# Patient Record
Sex: Female | Born: 1970 | Race: White | Hispanic: No | Marital: Married | Smoking: Former smoker
Health system: Southern US, Community
[De-identification: ages and names within clinical notes are randomized; demographics above are authoritative.]

## PROBLEM LIST (undated history)

## (undated) ENCOUNTER — Emergency Department (HOSPITAL_COMMUNITY): Admission: EM | Payer: Medicaid Other | Source: Home / Self Care

## (undated) DIAGNOSIS — I10 Essential (primary) hypertension: Secondary | ICD-10-CM

## (undated) DIAGNOSIS — E119 Type 2 diabetes mellitus without complications: Secondary | ICD-10-CM

## (undated) DIAGNOSIS — F32A Depression, unspecified: Secondary | ICD-10-CM

## (undated) DIAGNOSIS — G43909 Migraine, unspecified, not intractable, without status migrainosus: Secondary | ICD-10-CM

## (undated) DIAGNOSIS — N979 Female infertility, unspecified: Secondary | ICD-10-CM

## (undated) HISTORY — DX: Depression, unspecified: F32.A

## (undated) HISTORY — DX: Essential (primary) hypertension: I10

## (undated) HISTORY — DX: Migraine, unspecified, not intractable, without status migrainosus: G43.909

## (undated) HISTORY — PX: WRIST SURGERY: SHX841

## (undated) HISTORY — DX: Female infertility, unspecified: N97.9

## (undated) HISTORY — DX: Type 2 diabetes mellitus without complications: E11.9

---

## 2001-02-10 ENCOUNTER — Other Ambulatory Visit: Admission: RE | Admit: 2001-02-10 | Discharge: 2001-02-10 | Payer: Self-pay | Admitting: Obstetrics and Gynecology

## 2001-04-14 ENCOUNTER — Ambulatory Visit (HOSPITAL_COMMUNITY): Admission: RE | Admit: 2001-04-14 | Discharge: 2001-04-14 | Payer: Self-pay | Admitting: Obstetrics and Gynecology

## 2001-04-14 ENCOUNTER — Encounter: Payer: Self-pay | Admitting: Obstetrics and Gynecology

## 2001-06-13 ENCOUNTER — Encounter: Payer: Self-pay | Admitting: Obstetrics and Gynecology

## 2001-06-13 ENCOUNTER — Ambulatory Visit (HOSPITAL_COMMUNITY): Admission: RE | Admit: 2001-06-13 | Discharge: 2001-06-13 | Payer: Self-pay | Admitting: Obstetrics and Gynecology

## 2001-06-19 ENCOUNTER — Encounter (INDEPENDENT_AMBULATORY_CARE_PROVIDER_SITE_OTHER): Payer: Self-pay | Admitting: *Deleted

## 2001-06-20 ENCOUNTER — Inpatient Hospital Stay (HOSPITAL_COMMUNITY): Admission: AD | Admit: 2001-06-20 | Discharge: 2001-06-23 | Payer: Self-pay | Admitting: Obstetrics and Gynecology

## 2001-06-26 ENCOUNTER — Inpatient Hospital Stay (HOSPITAL_COMMUNITY): Admission: AD | Admit: 2001-06-26 | Discharge: 2001-06-26 | Payer: Self-pay | Admitting: Obstetrics and Gynecology

## 2001-07-03 ENCOUNTER — Encounter: Admission: RE | Admit: 2001-07-03 | Discharge: 2001-08-02 | Payer: Self-pay | Admitting: Obstetrics and Gynecology

## 2002-04-13 ENCOUNTER — Other Ambulatory Visit: Admission: RE | Admit: 2002-04-13 | Discharge: 2002-04-13 | Payer: Self-pay | Admitting: Obstetrics and Gynecology

## 2003-01-02 ENCOUNTER — Encounter: Payer: Self-pay | Admitting: Family Medicine

## 2003-01-02 ENCOUNTER — Emergency Department (HOSPITAL_COMMUNITY): Admission: AD | Admit: 2003-01-02 | Discharge: 2003-01-02 | Payer: Self-pay | Admitting: Family Medicine

## 2007-04-08 ENCOUNTER — Emergency Department (HOSPITAL_COMMUNITY): Admission: EM | Admit: 2007-04-08 | Discharge: 2007-04-08 | Payer: Self-pay | Admitting: Family Medicine

## 2007-05-08 ENCOUNTER — Emergency Department (HOSPITAL_COMMUNITY): Admission: EM | Admit: 2007-05-08 | Discharge: 2007-05-08 | Payer: Self-pay | Admitting: Family Medicine

## 2008-06-18 ENCOUNTER — Encounter: Admission: RE | Admit: 2008-06-18 | Discharge: 2008-06-18 | Payer: Self-pay | Admitting: Family Medicine

## 2008-06-28 ENCOUNTER — Encounter: Admission: RE | Admit: 2008-06-28 | Discharge: 2008-06-28 | Payer: Self-pay | Admitting: Family Medicine

## 2009-01-05 ENCOUNTER — Emergency Department (HOSPITAL_COMMUNITY): Admission: EM | Admit: 2009-01-05 | Discharge: 2009-01-05 | Payer: Self-pay | Admitting: Emergency Medicine

## 2010-07-28 NOTE — H&P (Signed)
[facility]  Patient:    Tina Kerr, Tina Kerr Visit Number: [id number] MRN: [id number]          Service Type: OBS Location: [facility] Attending Physician:  Leonard Schwartz Dictated by:   Saverio Danker, C.N.M. Admit Date:  06/19/2001                           History and Physical  BRIEF HISTORY:   The patient is a 40 year old married white female, gravida 5, para 1, 0-3-2, at [redacted] weeks gestation by LMP and ultrasound who presents for evaluation secondary to some blood pressure elevations at the office and 1+ protein on a catheterized specimen.  He denies headaches, nausea, vomiting or visual disturbances.  Her pregnancy has been followed at [facility] by the Certified Nurse Midwife Service to date and is at risk secondary to: 1. A history of previous twin gestation 2. Previous preeclampsia with that gestation. 3. Previous C-section secondary to failure to dilate and desires trial of    labor. 4. History of hypothyroidism diagnosed 7 months post partum but has had no    medications with this pregnancy.  OB GYN HISTORY:  She is a gravida 5, para 1-0-3-1 who had selective ABs in 1990, 1991, and 1992 all without complications.  In May of 2001, she delivered a viable set of twins, the female infant was 6 pounds 5 ounces at [redacted] weeks gestation.  The female was 4 pounds 11 ounces, also of course at [redacted] weeks gestation.  She was induced for approximately 24 hours with Pitocin but did not dilate beyond the fingertip and was then recommended to proceed with a C-section.  She reports that she had "mild" preeclampsia with that pregnancy with elevated liver enzymes and proteinuria.  She reports that she never had elevated blood pressures and was never put on magnesium sulfate.  That pregnancy was conceived by in vitro fertilization.  GENERAL MEDICAL HISTORY:  She has no known drug allergies.  She reports having had the usual childhood diseases.   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  She reports occasional urinary tract infections, and a history of depression following delivery, a history of smoking for 9 years but she discontinued that in 2000 and her only surgeries were a C-section in 2001, and wisdom teeth removed in 1998.  FAMILY HISTORY:  Significant for paternal grandfather and maternal grandfather with heart disease and myocardial infarction.  Maternal grandmother with emphysema, maternal grandmother with hyperthyroidism.  Maternal grandmother with rheumatoid arthritis.  Paternal grandfather with CVA.  GENETIC HISTORY:  Significant only for a niece with some kind of congenital heart disease that required surgery and the father of the infants niece with some kind of blood clotting disorder.  SOCIAL HISTORY:  She is married to Derry Skill who is involved and supportive.  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 They are of the [employer].  They deny any illicit drug use, alcohol or smoking with this pregnancy.  PRENATAL LABORATORY DATA:  Her blood type is A+, antibody screen is negative, toxoplasmosis screen was negative, syphilis is nonreactive.  Rubella is immune.  HBsAg is negative.  TSH in February 2003, was 0.877.  Her GC and chlamydia are both  negative.  Pap smear is within normal limits.  Her 1 hour glucola was 94, and her beta strep culture at 36 weeks is negative.  Group B streptococcus is negative.  PHYSICAL EXAMINATION:  VITAL SIGNS:  Her blood pressures varied from 125 to 133 systolically over 85 to 90 diastolics.  Temperature:  She is afebrile.  HEENT:  Grossly within normal limits  HEART:  Regular rhythm and rate.  CHEST:  Clear.  BREASTS:  Soft and nontender.  ABDOMEN:  Gravid with  irregular mild uterine contractions.  FETAL HEART RATE:  Reactive and reassuring.  PELVIC:  Closed, thick, posterior and vertex per Mack Guise, C.N.M., at the office.  EXTREMITIES:  Within normal limits.  Her catheterized urinalysis protein was 1+, 250 of blood, otherwise negative. Her platelets were 234.  SGOT was 14, SGPT 12, uric acid was 5.7, LDH was 127.   ASSESSMENT: 1. Intrauterine pregnancy at [redacted] weeks gestation. 2. Previous C-section. 3. Desirous of trial of labor and proteinuria on a catheterized specimen and    increasing blood pressures with a history of preeclampsia with her last    pregnancy.  PLAN:  Per consult with Dr. Dierdre Forth is to discuss the options of induction of labor and its risks and benefits of a repeat C-section or the possibility of continued observation and further evaluation.  These were reviewed at length and the patient has elected to proceed with further evaluation including a 24-hour urine, to verify the diagnosis of preeclampsia prior to committing to a more invasive plan of action. Dictated by:   Vance Gather Duplantis, C.N.M. Attending Physician:  Leonard Schwartz DD:  06/19/01 TD:  06/19/01 Job: [id number]

## 2010-07-28 NOTE — Op Note (Signed)
[facility]  Patient:    Tina Kerr, Tina Kerr Visit Number: [id number] MRN: [id number]          Service Type: OBS Location: 910A 9139 01 Attending Physician:  Leonard Schwartz Dictated by:   Pierre Bali Normand Sloop, M.D. Proc. Date: 06/20/01 Admit Date:  06/19/2001                             Operative Report  PREOPERATIVE DIAGNOSIS:       Pregnancy at term with pregnancy-induced                               hypertension.  POSTOPERATIVE DIAGNOSIS:      Pregnancy at term with pregnancy-induced                               hypertension.  OPERATION:                    Repeat cesarean section.  SURGEON:                      Naima A. Normand Sloop, M.D.  ASSISTANT:                    Nigel Bridgeman, C.N.M.  ANESTHESIA:                   Spinal.  ESTIMATED BLOOD LOSS:         1000 cc.  IV FLUIDS:                    1500 cc.  URINE OUTPUT:                 200 cc of clear urine.  FINDINGS:                     A female infant, vertex presentation with Apgars of 9 and 9.  Weight of 6 pounds and 2 ounces.  Normal uterus, tubes, and ovaries bilaterally.  COMPLICATIONS:                None.  The patient went to the recovery room in stable condition.  DESCRIPTION OF PROCEDURE:     The patient was taken to the operating room before the procedure was to take place.  The patient understood the risks are, but not limited to bleeding, infection, damage to internal organs such as bowel, bladder, and ureters, or major blood vessels.  The patient was taken to the operating room where she was given spinal anesthesia, placed in the dorsal supine position with a left lateral tilt, and prepped and draped in the normal sterile fashion.  A Pfannenstiel skin incision was then made 2 cm above the symphysis pubis with the knife and carried down to the fascia using Bovie cautery.  The fascia was then excised in the midline and extended bilaterally using the Bovie cautery.   Kochers x2 were placed on the superior aspect of the fascia which was dissected off the rectus muscle both bluntly and sharply using Mayo scissors.  The inferior aspect of the fascia was separated from the rectus muscle in a similar fashion.  The rectus muscle was then separated in the midline.  The peritoneum was identified, tinted up, and entered bluntly.  A  bladder blade was then inserted.  The vesicouterine peritoneum was identified, tinted up, and entered sharply with the Metzenbaum scissors, and extended bilaterally, and a bladder flap was created digitally.  The bladder blade was then reinserted.  A repeat lower transverse uterine incision was then made with the knife and extended bluntly.  The infant was delivered without difficulty.  There was no meconium and no nuchal cord.  The mouth and nares were suctioned with bulb suction and body delivered.  Cord clamped and cut.  The infant was handed off to the waiting pediatricians.  Cord blood was obtained and the placenta was manually extracted.  The uterus cleared of all clot and debris.  The uterine incision was repaired with 0 Vicryl in a running locked fashion.  There was two areas on the uterus which still had some bleeding which was made hemostatic using figure-of-eight reinforcement stitch.  The abdomen was irrigated with saline. The rectus muscle was approximated using a figure-of-eight 0 Vicryl stitch. The fascia was closed using 0 Vicryl in a running fashion.  The subcutaneous tissue was irrigated copiously with saline.  Any bleeding areas were made hemostatic using Bovie cautery.  The skin was closed with staples.  Sponge, lap, and needle counts were correct x2.  The patient went to the recovery room in stable condition. Dictated by:   Pierre Bali. Normand Sloop, M.D. Attending Physician:  Leonard Schwartz DD:  06/20/01 TD:  06/22/01 Job: [id number]

## 2010-07-28 NOTE — Discharge Summary (Signed)
[facility]  Patient:    Tina Kerr, Tina Kerr Visit Number: [id number] MRN: [id number]          Service Type: OBS Location: 910A 9139 01 Attending Physician:  Leonard Schwartz Dictated by:   Wynelle Bourgeois, CNM Admit Date:  06/19/2001 Discharge Date: 06/23/2001                             Discharge Summary  ADMISSION DIAGNOSES: 1. Intrauterine pregnancy at 37 weeks. 2. Previous cesarean section. 3. Planned trial of labor. 4. Proteinuria. 5. Hypertension.  DISCHARGE DIAGNOSES: 1. Intrauterine pregnancy at 37 weeks. 2. Previous cesarean section. 3. Planned trial of labor. 4. Proteinuria. 5. Hypertension. 6. Status post elective repeat cesarean section.  PROCEDURES: 1. Spinal anesthesia. 2. Repeat cesarean section.  HOSPITAL COURSE:  The patient was admitted for proteinuria on a catheter urine specimen and elevated blood pressures.  At that point, she had no headache or nausea or vomiting or visual disturbances.  Her pregnancy had been remarkable for a previous cesarean section secondary to failure to progress and a history of preeclampsia with her first pregnancy which was a twin gestation.  She was admitted for an elective trial of labor, and had an unfavorable cervix at the beginning of the induction.  Risks and benefits were discussed by Dr. Pennie Rushing, and she elected to proceed with a trial of labor.  As the day went on, her blood pressures increased to 181/100.  A 24 hour urine was in process, but had increasing blood pressures.  Decision was made by the parents to proceed with an elective repeat cesarean section which was performed on 06/20/01, for a viable female infant in a vertex presentation with Apgars of 9 and 9, weight 6 pounds 2 ounces with no complications.  The patient has recovered well, and has stabilized her blood pressures within normal limits to 120s to 140s/70s to 80s with no further development of proteinuria or  other signs of preeclampsia. She was not placed on magnesium sulfate postoperatively.  On postoperative day #3, her vital signs were stable, she was afebrile, lungs were clear, heart rate was regular rate and rhythm, there was no edema.  Deep tendon reflexes were 1+ to 2+ with no beats of clonus.  Abdomen was soft and appropriately tender.  Incision was clean, dry, and intact with staples.  Lochia was small. Extremities within normal limits.  She was deemed to have received the full benefit of her hospital stay and was discharged home on postoperative day #3.  DISCHARGE MEDICATIONS: 1. Motrin 600 mg p.o. q.6h. p.r.n. 2. Tylox one or two p.o. q.3-4h. p.r.n. 3. Prenatal vitamins.  DISCHARGE LABORATORY DATA:  White blood cell count 8.9, hemoglobin 10.8, hematocrit 32.3, platelets 225.  Chemistries within normal limits.  Uric acid 5.8 on June 20, 2001.  TSH was 1.244.  A 24 hour urine showed less than 6 mg/dL of protein.  RPR nonreactive.  DISCHARGE FOLLOWUP:  In six weeks at [facility], as well as Wednesday for staple removal.  DISCHARGE INSTRUCTIONS:  Per [facility] handout. Dictated by:   Wynelle Bourgeois, CNM Attending Physician:  Leonard Schwartz DD:  06/23/01 TD:  06/23/01 Job: [phone] [id number]

## 2011-05-08 ENCOUNTER — Ambulatory Visit (INDEPENDENT_AMBULATORY_CARE_PROVIDER_SITE_OTHER): Payer: Self-pay | Admitting: Family Medicine

## 2011-05-08 VITALS — BP 136/85 | HR 91 | Temp 98.1°F | Resp 16 | Ht 64.5 in | Wt 216.0 lb

## 2011-05-08 DIAGNOSIS — I10 Essential (primary) hypertension: Secondary | ICD-10-CM

## 2011-05-08 DIAGNOSIS — R002 Palpitations: Secondary | ICD-10-CM

## 2011-05-08 MED ORDER — LISINOPRIL 10 MG PO TABS: 10.0000 mg | ORAL_TABLET | Freq: Every day | ORAL | Status: DC

## 2011-05-08 NOTE — Progress Notes (Signed)
  Subjective:    Patient ID: Tina Kerr, female    DOB: December 01, 1970, 41 y.o.   MRN: [id number]  HPI 41 yo female with history of hypertension, off meds for 6 months, but has been following BP's at home. Did take lisinopril 10mg .  Has been feeling dizzy so took BP and was 140/100. No CP or SOB.  Did feel some palpitations at work today, brief.   Review of Systems Negative except as per HPI     Objective:   Physical Exam  Constitutional: She appears well-developed and well-nourished.  Cardiovascular: Normal rate, regular rhythm, normal heart sounds and intact distal pulses.   No murmur heard. Pulmonary/Chest: Effort normal and breath sounds normal.  Neurological: She is alert.  Skin: Skin is warm and dry.    EKG - NSR, normal ST -T, normal intervals.      Assessment & Plan:  HTN - start lisinopril 10.  Monitor at home.  Call if not getting to goal in a week. BMET pending.

## 2011-05-09 LAB — BASIC METABOLIC PANEL
BUN: 19 mg/dL (ref 6–23)
Calcium: 9.3 mg/dL (ref 8.4–10.5)
Creat: 0.74 mg/dL (ref 0.50–1.10)

## 2011-11-23 ENCOUNTER — Emergency Department (INDEPENDENT_AMBULATORY_CARE_PROVIDER_SITE_OTHER)
Admission: EM | Admit: 2011-11-23 | Discharge: 2011-11-23 | Disposition: A | Discharge status: Home / Self Care | Payer: BC Managed Care – PPO | Source: Home / Self Care

## 2011-11-23 ENCOUNTER — Encounter (HOSPITAL_COMMUNITY): Payer: Self-pay | Admitting: Emergency Medicine

## 2011-11-23 DIAGNOSIS — I10 Essential (primary) hypertension: Secondary | ICD-10-CM

## 2011-11-23 DIAGNOSIS — R609 Edema, unspecified: Secondary | ICD-10-CM

## 2011-11-23 DIAGNOSIS — R600 Localized edema: Secondary | ICD-10-CM

## 2011-11-23 MED ORDER — LISINOPRIL-HYDROCHLOROTHIAZIDE 20-12.5 MG PO TABS: 1.0000 | ORAL_TABLET | Freq: Every day | ORAL | Status: DC

## 2011-11-23 NOTE — ED Provider Notes (Signed)
Medical screening examination/treatment/procedure(s) were performed by non-physician practitioner and as supervising physician I was immediately available for consultation/collaboration.  Petr Bontempo, M.D.   Monroe Toure C Tyrin Herbers, MD 11/23/11 2318 

## 2011-11-23 NOTE — ED Provider Notes (Signed)
History     CSN: [id number]  Arrival date & time 11/23/11  1632   None     Chief Complaint  Patient presents with  . 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  Denies pain. No trauma, no prolonged standing or sitting. No hx of similar edema. No pain but some discomfort in the legs. No hx of venous insufficiency.    History reviewed. No pertinent past medical history.  Past Surgical History  Procedure Date  . Cesarean section     No family history on file.  History  Substance Use Topics  . Smoking status: Former Smoker    Quit date: 04/07/2011  . Smokeless tobacco: Not on file  . Alcohol Use: Not on file    OB History    Grav Para Term Preterm Abortions TAB SAB Ect Mult Living                  Review of Systems  Constitutional: Negative.  Negative for fever, chills, diaphoresis, activity change, appetite change and fatigue.  Respiratory: Negative.   Cardiovascular: Negative.   Gastrointestinal: Negative.   Genitourinary: Negative.   Musculoskeletal: Negative for back pain, joint swelling and gait problem.  Skin: Negative for color change, pallor, rash and wound.  Neurological: Negative.     Allergies  Review of patient's allergies indicates no known allergies.  Home Medications   Current Outpatient Rx  Name Route Sig Dispense Refill  . LEVONORGESTREL 20 MCG/24HR IU IUD Intrauterine 1 each by Intrauterine route once.    Marland Kitchen LISINOPRIL-HYDROCHLOROTHIAZIDE 20-12.5 MG PO TABS Oral Take 1 tablet by mouth daily. 30 tablet 1    BP 140/100  Pulse 90  Temp 99 F (37.2 C) (Oral)  Resp 18  SpO2 98%  Physical Exam  Constitutional: She is oriented to person, place, and time. She appears well-developed and well-nourished. No distress.  Eyes: EOM are normal. Pupils are equal, round, and reactive to light.    Neck: Normal range of motion. Neck supple.  Cardiovascular: Normal rate and normal heart sounds.   No murmur heard. Pulmonary/Chest: Effort normal and breath sounds normal. No respiratory distress. She has no wheezes. She has no rales.  Musculoskeletal: Normal range of motion. She exhibits edema. She exhibits no tenderness.       1+ pitting edema to shins, mild edema. No calf or other tenderness, no discoloration.   Neurological: She is alert and oriented to person, place, and time.  Skin: Skin is warm and dry. No rash noted. No erythema.    ED Course  Procedures (including critical care time)  Labs Reviewed - No data to display No results found.   1. Edema of both legs   2. Hypertension       MDM  Reassurance, Compression stockings of low pressure, elevate legs. Change to Lisinopril 20/12.5 q d and see your MD in 2 weeks for BP chk.  For pain, redness or increased edema Go to ED         Hayden Rasmussen, NP 11/23/11 2218

## 2011-11-23 NOTE — ED Notes (Signed)
C/o of bilateral leg swelling below knee since yesterday. Denies injury Pedal pluses present. States feels bloated. Ate chinese food yesterday

## 2018-05-28 ENCOUNTER — Telehealth: Payer: Self-pay | Admitting: Family

## 2018-05-28 DIAGNOSIS — R6889 Other general symptoms and signs: Secondary | ICD-10-CM

## 2018-05-28 NOTE — Progress Notes (Signed)
E-Visit for Corona Virus Screening Based on your current symptoms, it seems like that your symptoms could be related to the [place] virus. It is very important that you self isolate and avoid contact with anyone over the age of 29 years or older.   Rest, force fluids, tylenol or motrin as needed. If your symptoms worsen or do not improve you will need to be seen face to face.   Coronavirus disease 2019 (COVID-19) is a respiratory illness that can spread from person to person. The virus that causes COVID-19 is a new virus that was first identified in the country of [place] but is now found in multiple other countries and has spread to the [place].  Symptoms associated with the virus are mild to severe fever, cough, and shortness of breath. There is currently no vaccine to protect against COVID-19, and there is no specific antiviral treatment for the virus.  It is vitally important that if you feel that you have an infection such as this virus or any other virus that you stay home and away from places where you may spread it to others.  Currently, not all patients are being tested. If the symptoms are mild and there is not a known exposure, performing the test is not indicated.   Reduce your risk of any infection by using the same precautions used for avoiding the common cold or flu:  Marland Kitchen Wash your hands often with soap and warm water for at least 20 seconds.  If soap and water are not readily available, use an alcohol-based hand sanitizer with at least 60% alcohol.  . If coughing or sneezing, cover your mouth and nose by coughing or sneezing into the elbow areas of your shirt or coat, into a tissue or into your sleeve (not your hands). . Avoid shaking hands with others and consider head nods or verbal greetings only. . Avoid touching your eyes, nose, or mouth with unwashed hands.  . Avoid close contact with people who are sick. . Avoid places or events with large numbers of people in one location,  like concerts or sporting events. . Carefully consider travel plans you have or are making. . If you are planning any travel outside or inside the [place], visit the CDC's Travelers' Health webpage for the latest health notices. . If you have some symptoms but not all symptoms, continue to monitor at home and seek medical attention if your symptoms worsen. . If you are having a medical emergency, call 911.  HOME CARE . Only take medications as instructed by your medical team. . Drink plenty of fluids and get plenty of rest. . A steam or ultrasonic humidifier can help if you have congestion.   GET HELP RIGHT AWAY IF: . You develop worsening fever. . You become short of breath . You cough up blood. . Your symptoms become more severe MAKE SURE YOU   Understand these instructions.  Will watch your condition.  Will get help right away if you are not doing well or get worse.  Your e-visit answers were reviewed by a board certified advanced clinical practitioner to complete your personal care plan.  Depending on the condition, your plan could have included both over the counter or prescription medications.  If there is a problem please reply once you have received a response from your provider. Your safety is important to [place].  If you have drug allergies check your prescription carefully.    You can use MyChart to ask questions  about today's visit, request a non-urgent call back, or ask for a work or school excuse for 24 hours related to this e-Visit. If it has been greater than 24 hours you will need to follow up with your provider, or enter a new e-Visit to address those concerns. You will get an e-mail in the next two days asking about your experience.  I hope that your e-visit has been valuable and will speed your recovery. Thank you for using e-visits.

## 2019-05-09 ENCOUNTER — Ambulatory Visit: Payer: Self-pay | Attending: Internal Medicine

## 2019-05-09 DIAGNOSIS — Z23 Encounter for immunization: Secondary | ICD-10-CM | POA: Insufficient documentation

## 2019-05-09 NOTE — Progress Notes (Signed)
   Covid-19 Vaccination Clinic  Name:  Tina Kerr    MRN: [id number] DOB: [birth year]  05/09/2019  [name] was observed post Covid-19 immunization for 15 minutes without incidence. She was provided with Vaccine Information Sheet and instruction to access the V-Safe system.   [name] was instructed to call 911 with any severe reactions post vaccine: Marland Kitchen Difficulty breathing  . Swelling of your face and throat  . A fast heartbeat  . A bad rash all over your body  . Dizziness and weakness    Immunizations Administered    Name Date Dose VIS Date Route   Pfizer COVID-19 Vaccine 05/09/2019  2:30 PM 0.3 mL 02/20/2019 Intramuscular   Manufacturer: [employer], [employer]   Lot: [id number]   NDC: [address]-2

## 2019-05-30 ENCOUNTER — Ambulatory Visit: Payer: Medicaid Other | Attending: Internal Medicine

## 2019-05-30 DIAGNOSIS — Z23 Encounter for immunization: Secondary | ICD-10-CM

## 2019-05-30 NOTE — Progress Notes (Signed)
   Covid-19 Vaccination Clinic  Name:  Tina Kerr    MRN: [id number] DOB: 1971-02-06  05/30/2019  [name] was observed post Covid-19 immunization for 15 minutes without incident. She was provided with Vaccine Information Sheet and instruction to access the V-Safe system.   [name] was instructed to call 911 with any severe reactions post vaccine: Marland Kitchen Difficulty breathing  . Swelling of face and throat  . A fast heartbeat  . A bad rash all over body  . Dizziness and weakness   Immunizations Administered    Name Date Dose VIS Date Route   Pfizer COVID-19 Vaccine 05/30/2019 11:08 AM 0.3 mL 02/20/2019 Intramuscular   Manufacturer: [employer], [employer]   Lot: [id number]   NDC: [address]-9

## 2020-07-14 LAB — EXTERNAL GENERIC LAB PROCEDURE: COLOGUARD: NEGATIVE

## 2020-07-14 LAB — COLOGUARD: COLOGUARD: NEGATIVE

## 2020-12-20 ENCOUNTER — Emergency Department (HOSPITAL_COMMUNITY): Payer: 59

## 2020-12-20 ENCOUNTER — Other Ambulatory Visit: Payer: Self-pay

## 2020-12-20 ENCOUNTER — Inpatient Hospital Stay (HOSPITAL_COMMUNITY)
Admission: EM | Admit: 2020-12-20 | Discharge: 2020-12-21 | DRG: 311 | Disposition: A | Discharge status: Home / Self Care | Payer: 59 | Attending: Internal Medicine | Admitting: Internal Medicine

## 2020-12-20 DIAGNOSIS — Z20822 Contact with and (suspected) exposure to covid-19: Secondary | ICD-10-CM | POA: Diagnosis present

## 2020-12-20 DIAGNOSIS — E871 Hypo-osmolality and hyponatremia: Secondary | ICD-10-CM | POA: Diagnosis present

## 2020-12-20 DIAGNOSIS — R079 Chest pain, unspecified: Secondary | ICD-10-CM

## 2020-12-20 DIAGNOSIS — R6884 Jaw pain: Secondary | ICD-10-CM | POA: Diagnosis present

## 2020-12-20 DIAGNOSIS — Z8249 Family history of ischemic heart disease and other diseases of the circulatory system: Secondary | ICD-10-CM | POA: Diagnosis not present

## 2020-12-20 DIAGNOSIS — I200 Unstable angina: Principal | ICD-10-CM | POA: Diagnosis present

## 2020-12-20 DIAGNOSIS — R072 Precordial pain: Secondary | ICD-10-CM | POA: Diagnosis not present

## 2020-12-20 DIAGNOSIS — E876 Hypokalemia: Secondary | ICD-10-CM | POA: Diagnosis present

## 2020-12-20 DIAGNOSIS — I10 Essential (primary) hypertension: Secondary | ICD-10-CM | POA: Diagnosis present

## 2020-12-20 DIAGNOSIS — T502X5A Adverse effect of carbonic-anhydrase inhibitors, benzothiadiazides and other diuretics, initial encounter: Secondary | ICD-10-CM | POA: Diagnosis present

## 2020-12-20 DIAGNOSIS — Z87891 Personal history of nicotine dependence: Secondary | ICD-10-CM | POA: Diagnosis not present

## 2020-12-20 DIAGNOSIS — R7303 Prediabetes: Secondary | ICD-10-CM | POA: Diagnosis present

## 2020-12-20 DIAGNOSIS — K219 Gastro-esophageal reflux disease without esophagitis: Secondary | ICD-10-CM | POA: Diagnosis present

## 2020-12-20 DIAGNOSIS — E785 Hyperlipidemia, unspecified: Secondary | ICD-10-CM | POA: Diagnosis present

## 2020-12-20 DIAGNOSIS — K760 Fatty (change of) liver, not elsewhere classified: Secondary | ICD-10-CM | POA: Diagnosis present

## 2020-12-20 DIAGNOSIS — R109 Unspecified abdominal pain: Secondary | ICD-10-CM

## 2020-12-20 DIAGNOSIS — Z8619 Personal history of other infectious and parasitic diseases: Secondary | ICD-10-CM

## 2020-12-20 DIAGNOSIS — Z79899 Other long term (current) drug therapy: Secondary | ICD-10-CM

## 2020-12-20 LAB — CBC
HCT: 44.9 % (ref 36.0–46.0)
Hemoglobin: 14.1 g/dL (ref 12.0–15.0)
MCH: 28.1 pg (ref 26.0–34.0)
MCHC: 31.4 g/dL (ref 30.0–36.0)
MCV: 89.4 fL (ref 80.0–100.0)
Platelets: 369 10*3/uL (ref 150–400)
RBC: 5.02 MIL/uL (ref 3.87–5.11)
RDW: 12.9 % (ref 11.5–15.5)
WBC: 12.4 10*3/uL — ABNORMAL HIGH (ref 4.0–10.5)
nRBC: 0 % (ref 0.0–0.2)

## 2020-12-20 LAB — TROPONIN I (HIGH SENSITIVITY)
Troponin I (High Sensitivity): 3 ng/L (ref <18)
Troponin I (High Sensitivity): 2 ng/L (ref <18)

## 2020-12-20 LAB — BASIC METABOLIC PANEL
Anion gap: 12 (ref 5–15)
BUN: 23 mg/dL — ABNORMAL HIGH (ref 6–20)
CO2: 28 mmol/L (ref 22–32)
Calcium: 9.3 mg/dL (ref 8.9–10.3)
Chloride: 97 mmol/L — ABNORMAL LOW (ref 98–111)
Creatinine, Ser: 0.81 mg/dL (ref 0.44–1.00)
GFR, Estimated: >60 mL/min (ref >60)
Glucose, Bld: 93 mg/dL (ref 70–99)
Potassium: 3.3 mmol/L — ABNORMAL LOW (ref 3.5–5.1)
Sodium: 137 mmol/L (ref 135–145)

## 2020-12-20 LAB — RESP PANEL BY RT-PCR (FLU A&B, COVID) ARPGX2
Influenza A by PCR: NEGATIVE
Influenza B by PCR: NEGATIVE
SARS Coronavirus 2 by RT PCR: NEGATIVE

## 2020-12-20 LAB — I-STAT BETA HCG BLOOD, ED (MC, WL, AP ONLY): I-stat hCG, quantitative: 5.9 m[IU]/mL — ABNORMAL HIGH (ref <5)

## 2020-12-20 MED ORDER — IOHEXOL 350 MG/ML SOLN
80.0000 mL | Freq: Once | INTRAVENOUS | Status: AC | PRN
  Administered 2020-12-20: 80 mL via INTRAVENOUS

## 2020-12-20 MED ORDER — NITROGLYCERIN 0.4 MG SL SUBL: 0.4000 mg | SUBLINGUAL_TABLET | SUBLINGUAL | Status: DC | PRN

## 2020-12-20 MED ORDER — ENOXAPARIN SODIUM 40 MG/0.4ML IJ SOSY: 40.0000 mg | PREFILLED_SYRINGE | INTRAMUSCULAR | Status: DC

## 2020-12-20 MED ORDER — SODIUM CHLORIDE 0.9% FLUSH
3.0000 mL | Freq: Two times a day (BID) | INTRAVENOUS | Status: DC
  Administered 2020-12-20: 3 mL via INTRAVENOUS

## 2020-12-20 MED ORDER — ASPIRIN 325 MG PO TABS
325.0000 mg | ORAL_TABLET | Freq: Every day | ORAL | Status: DC
  Administered 2020-12-20 – 2020-12-21 (×2): 325 mg via ORAL
  Filled 2020-12-20 (×2): qty 1

## 2020-12-20 MED ORDER — ATORVASTATIN CALCIUM 40 MG PO TABS: 80.0000 mg | ORAL_TABLET | Freq: Every day | ORAL | Status: DC

## 2020-12-20 MED ORDER — ATORVASTATIN CALCIUM 80 MG PO TABS
80.0000 mg | ORAL_TABLET | Freq: Every day | ORAL | Status: DC
  Administered 2020-12-20 – 2020-12-21 (×2): 80 mg via ORAL
  Filled 2020-12-20 (×2): qty 2

## 2020-12-20 NOTE — ED Provider Notes (Signed)
[facility] EMERGENCY DEPARTMENT Provider Note   CSN: [id number] Arrival date & time: 12/20/20  1322     History Chief Complaint  Patient presents with   Chest Pain    Tina Kerr is a 50 y.o. female.  Tina Kerr is a 50 y.o. female  with prior medical history significant for HTN, obesity and prediabetes that is here today for evaluation of chest pain. She states that Sunday she was laying down when she had a sudden onset of right jaw and neck pain with central chest tightness and pressure. It was associated with diaphoresis and lightheadedness. Symptoms decreased in intensity after a few hours but never completely resolved. Yesterday she just felt fatigued throughout the day, as if she has done strenuous exercise, but had not exercised at all. Today she was sitting down and had another episode of left-sided chest pain that radiated down her left arm and into her neck and jaw. She describes the pain as an ache and as if she just worked out. The pain does not change with positions. Sometimes worse with inspiration. Endorses lightheadedness and shortness of breath. Denies headache, N/V, or abdominal pain. She did not take anything for the pain. No prior hx of cardiac disease. No family hx of heart disease that she knows of.  Pt was a former smoker, but quit several years ago.  The history is provided by the patient and medical records.      No past medical history on file.  There are no problems to display for this patient.   Past Surgical History:  Procedure Laterality Date   CESAREAN SECTION       OB History   No obstetric history on file.     No family history on file.  Social History   Tobacco Use   Smoking status: Former    Types: Cigarettes    Quit date: 04/07/2011    Years since quitting: 9.7    Home Medications Prior to Admission medications   Medication Sig Start Date End Date Taking? Authorizing Provider  levonorgestrel (MIRENA)  20 MCG/24HR IUD 1 each by Intrauterine route once.    [provider]  lisinopril-hydrochlorothiazide (PRINZIDE) 20-12.5 MG per tablet Take 1 tablet by mouth daily. 11/23/11 11/22/12  Hayden Rasmussen, NP  lisinopril (PRINIVIL,ZESTRIL) 10 MG tablet Take 1 tablet (10 mg total) by mouth daily. 05/08/11 11/23/11  Mayans, Assunta Gambles, MD    Allergies    Patient has no known allergies.  Review of Systems   Review of Systems  Constitutional:  Positive for fatigue. Negative for chills and fever.  HENT: Negative.    Respiratory:  Positive for shortness of breath. Negative for cough.   Cardiovascular:  Positive for chest pain.  Gastrointestinal:  Negative for abdominal pain, nausea and vomiting.  Musculoskeletal:  Positive for neck pain. Negative for arthralgias, back pain and myalgias.  Skin:  Negative for color change and rash.  Neurological:  Negative for weakness, numbness and headaches.  All other systems reviewed and are negative.  Physical Exam Updated Vital Signs BP 117/87 (BP Location: Left Arm)   Pulse 93   Temp 98 F (36.7 C) (Oral)   Resp 18   SpO2 100%   Physical Exam Vitals and nursing note reviewed.  Constitutional:      General: She is not in acute distress.    Appearance: Normal appearance. She is well-developed. She is not ill-appearing or diaphoretic.     Comments: Well-appearing and in no distress  HENT:     Head: Normocephalic and atraumatic.  Eyes:     General:        Right eye: No discharge.        Left eye: No discharge.     Pupils: Pupils are equal, round, and reactive to light.  Cardiovascular:     Rate and Rhythm: Normal rate and regular rhythm.     Pulses: Normal pulses.          Radial pulses are 2+ on the right side and 2+ on the left side.       Dorsalis pedis pulses are 2+ on the right side and 2+ on the left side.     Heart sounds: Normal heart sounds. No murmur heard.   No friction rub. No gallop.  Pulmonary:     Effort: Pulmonary effort is  normal. No respiratory distress.     Breath sounds: Normal breath sounds. No wheezing or rales.     Comments: Respirations equal and unlabored, patient able to speak in full sentences, lungs clear to auscultation bilaterally  Chest:     Chest wall: No tenderness.     Comments: Chest wall NTTP, pain not reproducible on exam Abdominal:     General: Bowel sounds are normal. There is no distension.     Palpations: Abdomen is soft. There is no mass.     Tenderness: There is no abdominal tenderness. There is no guarding.     Comments: Abdomen soft, nondistended, nontender to palpation in all quadrants without guarding or peritoneal signs  Musculoskeletal:        General: No deformity.     Cervical back: Neck supple.     Right lower leg: No tenderness. No edema.     Left lower leg: No tenderness. No edema.  Skin:    General: Skin is warm and dry.     Capillary Refill: Capillary refill takes less than 2 seconds.  Neurological:     Mental Status: She is alert and oriented to person, place, and time.     Coordination: Coordination normal.     Comments: Speech is clear, able to follow commands Moves extremities without ataxia, coordination intact  Psychiatric:        Mood and Affect: Mood normal.        Behavior: Behavior normal.    ED Results / Procedures / Treatments   Labs (all labs ordered are listed, but only abnormal results are displayed) Labs Reviewed  BASIC METABOLIC PANEL - Abnormal; Notable for the following components:      Result Value   Potassium 3.3 (*)    Chloride 97 (*)    BUN 23 (*)    All other components within normal limits  CBC - Abnormal; Notable for the following components:   WBC 12.4 (*)    All other components within normal limits  I-STAT BETA HCG BLOOD, ED ([facility], [facility], [facility] ONLY) - Abnormal; Notable for the following components:   I-stat hCG, quantitative 5.9 (*)    All other components within normal limits  RESP PANEL BY RT-PCR (FLU A&B, COVID) ARPGX2  CBC   BASIC METABOLIC PANEL  HEMOGLOBIN A1C  LIPID PANEL  MAGNESIUM  HIV ANTIBODY (ROUTINE TESTING W REFLEX)  HCG, QUANTITATIVE, PREGNANCY  TROPONIN I (HIGH SENSITIVITY)  TROPONIN I (HIGH SENSITIVITY)    EKG EKG Interpretation  Date/Time:  Tuesday December 20 2020 13:40:57 EDT Ventricular Rate:  91 PR Interval:  134 QRS Duration: 72 QT Interval:  358 QTC  Calculation: 440 R Axis:   19 Text Interpretation: Normal sinus rhythm Possible Anterior infarct , age undetermined Abnormal ECG Confirmed by Gloris Manchester [id number]) on 12/20/2020 4:17:50 PM  Radiology DG Chest 1 View  Result Date: 12/20/2020 CLINICAL DATA:  Chest pain, shortness of breath EXAM: CHEST  1 VIEW COMPARISON:  None. FINDINGS: Heart size appears within normal limits. Low lung volumes. No focal airspace consolidation, pleural effusion, or pneumothorax. IMPRESSION: Low lung volumes. No acute cardiopulmonary findings. Electronically Signed   By: Duanne Guess D.O.   On: 12/20/2020 14:51    Procedures Procedures   Medications Ordered in ED Medications  nitroGLYCERIN (NITROSTAT) SL tablet 0.4 mg (has no administration in time range)  aspirin tablet 325 mg (325 mg Oral Given 12/20/20 1732)  sodium chloride flush (NS) 0.9 % injection 3 mL (3 mLs Intravenous Given 12/20/20 2326)  atorvastatin (LIPITOR) tablet 80 mg (80 mg Oral Given 12/20/20 2322)  potassium chloride (KLOR-CON) packet 40 mEq (has no administration in time range)  iohexol (OMNIPAQUE) 350 MG/ML injection 80 mL (80 mLs Intravenous Contrast Given 12/20/20 1947)    ED Course  I have reviewed the triage vital signs and the nursing notes.  Pertinent labs & imaging results that were available during my care of the patient were reviewed by me and considered in my medical decision making (see chart for details).    MDM Rules/Calculators/A&P                           Patient presents to the emergency department with chest pain. Patient nontoxic appearing, in no  apparent distress, vitals without significant abnormality. Fairly benign physical exam.   No prior evaluation with cardiologist  DDX: ACS, pulmonary embolism, dissection, pneumothorax, pneumonia, arrhythmia, severe anemia, MSK, GERD, anxiety, abdominal process.   Additional history obtained:  Additional history obtained from chart review & nursing note review.   EKG: Sinus rhythm with some nonspecific t-wave changes  Lab Tests:  I Ordered, reviewed, and interpreted labs, which included:  CBC: No leukocytosis, normal hemoglobin BMP: Mild hypokalemia of 3.3, no other significant electrolyte derangements, normal renal function Troponin: Negative x2  Imaging Studies ordered:  I ordered imaging studies which included CXR and CTA chest, I independently reviewed, formal radiology impression shows:  No active cardio pulmonary disease  CTA without evidence of PE or other acute abnormalities  ED Course:   Heart Pathway Score 5  For outpatient symptoms are not exertional she does have chest pressure radiating to her left arm, neck and jaw that is concerning for potential cardiac etiology.  Given significant heart pathway score feel patient will need more urgent evaluation of symptoms, will discuss with cardiology.  Currently EKG without acute changes and troponins are negative.  Patient has been given aspirin.  Case discussed with Dr. Park Breed with cardiology recommends medicine admission, he will see the patient tonight and consult with plans for potential stress testing versus cath tomorrow.  Does not recommend starting heparin at this time given negative troponins.  I discussed this plan with the patient who expresses understanding and agreement.   Case discussed with internal medicine teaching service who will see and admit the patient.  Portions of this note were generated with Scientist, clinical (histocompatibility and immunogenetics). Dictation errors may occur despite best attempts at proofreading.    Final Clinical  Impression(s) / ED Diagnoses Final diagnoses:  Chest pain    Rx / DC Orders ED Discharge Orders  None        Dartha Lodge, [place] 12/22/20 0903    Gloris Manchester, MD 12/23/20 [phone]

## 2020-12-20 NOTE — H&P (Addendum)
Date: 12/21/2020               Patient Name:  Tina Kerr MRN: [id number]  DOB: Jan 02, 1971 Age / Sex: 50 y.o., female   PCP: Patient, No Pcp Per (Inactive)         Medical Service: Internal Medicine Teaching Service         Attending Physician: Dr. Mayford Knife, Dorene Ar, MD    First Contact: Princess Bruins Pager: [phone]  Second Contact: Verdene Lennert, MD Pager: IB [phone]       After Hours (After 5p/  First Contact Pager: [phone]  weekends / holidays): Second Contact Pager: [phone]   SUBJECTIVE   Chief Complaint: Chest pain  History of Present Illness: 50 year old female history of hypertension, hyperlipidemia, prediabetes, obesity, and no prior history of cardiac disease presented with complaints of chest pain with radiation to the neck and left arm.    Patient reports that beginning last Sunday she had a sharp stabbing pain that originated in her jaw and radiated down into her left lower chest and left arm which began while she was lying in bed.  She reports that she was very pale at the time but denies any diaphoresis.  She denies any nausea vomiting.  No dizziness or lightheadedness.  She reports that she notices the pain most when she sits down to rest, pain does not worsen with activity.  Moments where the chest pain improves but does not completely resolve.  She does have a mild chest pressure/achiness at rest currently but does not have any current radiation to neck or jaw.   She also endorses some shortness of breath that has been present since Sunday.  Reports that she has this shortness of breath at rest and feels short of breath while she talks.  She does endorse a slight cough yesterday but this has improved on its own.  She was not producing any sputum with this cough.  She has not felt febrile.  ED Course: Chest x-ray and CT angio of the chest were both negative. EKG normal sinus.  Troponins have remained negative and flat. Cardiology has been consulted and does not  recommend heparin at this point with plans for cath in the future. Admit for CP r/o.   Meds:  Current Meds  Medication Sig   chlorthalidone (HYGROTON) 25 MG tablet Take 25 mg by mouth daily.   levonorgestrel (MIRENA) 20 MCG/24HR IUD 1 each by Intrauterine route once.   losartan (COZAAR) 50 MG tablet Take 50 mg by mouth daily.   omeprazole (PRILOSEC) 20 MG capsule Take 20 mg by mouth daily.   potassium chloride (KLOR-CON) 10 MEQ tablet Take 10 mEq by mouth daily.   Red Yeast Rice Extract (RED YEAST RICE PO) Take 1 capsule by mouth daily.    No past medical history on file.  Past Surgical History:  Procedure Laterality Date   CESAREAN SECTION      Social:  Lives With: Husband and 3 adult children Occupation: Works at preschool Support: Family Level of Function: Independent PCP: None Substances: None  Family History: Heart disease in grandfather  Allergies: Allergies as of 12/20/2020   (No Known Allergies)    Review of Systems: A complete ROS was negative except as per HPI.   OBJECTIVE:   Physical Exam: Blood pressure 103/62, pulse 83, temperature 98 F (36.7 C), temperature source Oral, resp. rate 15, SpO2 98 %.  Constitutional: well-appearing female sitting in bed, in no acute distress HENT: normocephalic atraumatic, mucous  membranes moist Eyes: conjunctiva non-erythematous Neck: supple, no appreciable JVD Cardiovascular: regular rate and rhythm, no m/r/g, pulses intact, no lower extremity edema. Pulmonary/Chest: normal work of breathing on room air, lungs clear to auscultation bilaterally Abdominal: soft, non-tender, non-distended, positive bowel sounds MSK: normal bulk and tone Neurological: alert & oriented x 3, 5/5 strength in bilateral upper and lower extremities, normal gait Skin: warm and dry Psych: Mood and affect appropriate  Labs: CBC    Component Value Date/Time   WBC 12.4 (H) 12/20/2020 1418   RBC 5.02 12/20/2020 1418   HGB 14.1 12/20/2020 1418    HCT 44.9 12/20/2020 1418   PLT 369 12/20/2020 1418   MCV 89.4 12/20/2020 1418   MCH 28.1 12/20/2020 1418   MCHC 31.4 12/20/2020 1418   RDW 12.9 12/20/2020 1418     CMP     Component Value Date/Time   NA 137 12/20/2020 1418   K 3.3 (L) 12/20/2020 1418   CL 97 (L) 12/20/2020 1418   CO2 28 12/20/2020 1418   GLUCOSE 93 12/20/2020 1418   BUN 23 (H) 12/20/2020 1418   CREATININE 0.81 12/20/2020 1418   CREATININE 0.74 05/08/2011 1613   CALCIUM 9.3 12/20/2020 1418   GFRNONAA >60 12/20/2020 1418    Imaging: DG Chest 1 View  Result Date: 12/20/2020 CLINICAL DATA:  Chest pain, shortness of breath EXAM: CHEST  1 VIEW COMPARISON:  None. FINDINGS: Heart size appears within normal limits. Low lung volumes. No focal airspace consolidation, pleural effusion, or pneumothorax. IMPRESSION: Low lung volumes. No acute cardiopulmonary findings. Electronically Signed   By: Duanne Guess D.O.   On: 12/20/2020 14:51   CT Angio Chest PE W and/or Wo Contrast  Result Date: 12/20/2020 CLINICAL DATA:  Pulmonary embolus suspected with high probability. Chest pain and jaw pain starting a few days ago. Shortness of breath. EXAM: CT ANGIOGRAPHY CHEST WITH CONTRAST TECHNIQUE: Multidetector CT imaging of the chest was performed using the standard protocol during bolus administration of intravenous contrast. Multiplanar CT image reconstructions and MIPs were obtained to evaluate the vascular anatomy. CONTRAST:  82mL OMNIPAQUE IOHEXOL 350 MG/ML SOLN COMPARISON:  Chest radiograph 12/20/2020 FINDINGS: Cardiovascular: Suboptimal contrast bolus limits evaluation of the pulmonary arteries. There is moderately good visualization of the central and segmental pulmonary arteries without obvious filling defect. No definitive evidence of significant pulmonary embolus. Normal heart size. No pericardial effusions. Normal caliber thoracic aorta. Great vessel origins are patent. Mediastinum/Nodes: Esophagus is decompressed. No  significant lymphadenopathy. Thyroid gland is unremarkable. Lungs/Pleura: Evaluation is limited by motion artifact. No airspace disease or consolidation is suggested. No pleural effusions. No pneumothorax. Upper Abdomen: Prominent fatty infiltration of the liver. No acute abnormalities demonstrated in the visualized upper abdomen. Musculoskeletal: No chest wall abnormality. No acute or significant osseous findings. Review of the MIP images confirms the above findings. IMPRESSION: No evidence of significant pulmonary embolus on limited contrast bolus. No evidence of active pulmonary disease. Fatty infiltration of the liver. Electronically Signed   By: Burman Nieves M.D.   On: 12/20/2020 20:05    EKG: personally reviewed my interpretation is normal sinus   ASSESSMENT & PLAN:    Assessment & Plan by Problem: Active Problems:   Unstable angina (HCC)   Kasara Buckingham is a 50 y.o. with pertinent PMH of hyperlipidemia, prediabetes, hypertension who presented with chest pain and admitted for chest pain rule out on hospital day 1  #Unstable angina  atypical chest pain Multiple risk factors including prediabetes hypertension hyperlipidemia obesity with a  presentation concerning for unstable angina.   -Troponins have been flat and EKG showing no ST elevation and vital signs have been stable. - She denies any pain with deep inspiration making pleuritic chest pain less likely, and CT angio performed in the emergency department ruled out PE. -She is not having pain reproducible with palpation to the chest wall making MSK less likely. -She denies any symptoms of reflux that she does take Prilosec every day and does not have pain with swallowing/eating. -She does endorse a history of shingles though her pain does not follow a uniform dermatomal pattern. - Most recent LDL 130s and not on statin.  Her most recent A1c 5.8. Her 10-year ASCVD risk is only 4.7% -We will check lipid panel and A1c -We will  check mag - N.p.o. after midnight -Follow-up echocardiogram to assess for LV function given new shortness of breath -Continue cardiac monitoring - Cardiology has been consulted  #Hyperlipidemia Lipid panel from 2020 showing LDL 130s. We will start her on Lipitor 80 mg. Follow-up lipid panel  #Hypertension Holding home meds  Prediabetes Most recent A1c 5.8.  And GERD Prilosec 20 mg  Diet: Heart healthy VTE: None holding for possible cath tomorrow. IVF: None,None Code: Full  Prior to Admission Living Arrangement: Home, living with family Anticipated Discharge Location: Home Barriers to Discharge: Pending medical stability  Dispo: Admit patient to Observation with expected length of stay less than 2 midnights.  Signed: Adron Bene, MD Internal Medicine Resident PGY-1 Pager: [phone]  12/21/2020, 1:23 AM

## 2020-12-20 NOTE — ED Triage Notes (Signed)
Pt reports increase c/p since Sunday 12/18/2020. Pt reports pain radiates done left side.  HX: HTN

## 2020-12-20 NOTE — ED Notes (Signed)
Patient transported to CT 

## 2020-12-20 NOTE — ED Provider Notes (Signed)
Emergency Medicine Provider Triage Evaluation Note  Tina Kerr , a 50 y.o. female  was evaluated in triage.  Pt complains of chest pain and jaw pain that started a few days ago. Also has some sob and lightheadedness.  Review of Systems  Positive: Chest pain, jaw pain, sob, lightheaded Negative: Diaphoresis, nv  Physical Exam  There were no vitals taken for this visit. Gen:   Awake, no distress   Resp:  Normal effort  MSK:   Moves extremities without difficulty  Other:  Heart with rrr, lungs ctab  Medical Decision Making  Medically screening exam initiated at 1:47 PM.  Appropriate orders placed.  Rosa Sauve was informed that the remainder of the evaluation will be completed by another provider, this initial triage assessment does not replace that evaluation, and the importance of remaining in the ED until their evaluation is complete.     Rayne Du 12/20/20 1348    Terald Sleeper, MD 12/20/20 1819

## 2020-12-20 NOTE — Consult Note (Signed)
Cardiology Consultation:   Patient ID: Tina Kerr MRN: [id number]; DOB: [birth year]  Admit date: 12/20/2020 Date of Consult: 12/20/2020  PCP:  Patient, No Pcp Per (Inactive)   [facility] Providers Cardiologist:  None        Patient Profile:   Tina Kerr is a 50 y.o. female with HTN who is being seen 12/20/2020 for the evaluation of chest pain at the request of Dr. Durwin Nora  History of Present Illness:   Tina Kerr is a 50 y.o. female with HTN who is being seen 12/20/2020 for the evaluation of chest pain at the request of Dr. Durwin Nora. Since the pats few days she has been having these bouts of chest pain and fluttery feeling in her chest pain. Non-exertional. Sometimes increase with breathing in. In the ED, CTPE was negative. Troponins were flat; and EKG showed no significant findings. However, she continued to have this tingling/pressure in her left chest hence cardiology was consulted.    No past medical history on file.  Past Surgical History:  Procedure Laterality Date   CESAREAN SECTION         Inpatient Medications: Scheduled Meds:  aspirin  325 mg Oral Daily   Continuous Infusions:  PRN Meds: nitroGLYCERIN  Allergies:   No Known Allergies  Social History:   Social History   Socioeconomic History   Marital status: Married    Spouse name: Not on file   Number of children: Not on file   Years of education: Not on file   Highest education level: Not on file  Occupational History   Not on file  Tobacco Use   Smoking status: Former    Types: Cigarettes    Quit date: 04/07/2011    Years since quitting: 9.7   Smokeless tobacco: Not on file  Substance and Sexual Activity   Alcohol use: Not on file   Drug use: Not on file   Sexual activity: Not on file  Other Topics Concern   Not on file  Social History Narrative   Not on file   Social Determinants of Health   Financial Resource Strain: Not on file  Food Insecurity: Not on file   Transportation Needs: Not on file  Physical Activity: Not on file  Stress: Not on file  Social Connections: Not on file  Intimate Partner Violence: Not on file    Family History:   No Hx of sudden deaths  ROS:  Please see the history of present illness.  All other ROS reviewed and negative.     Physical Exam/Data:   Vitals:   12/20/20 2100 12/20/20 2130 12/20/20 2200 12/20/20 2230  BP: (!) 115/53 103/62 127/84 114/76  Pulse: 82 83 85 88  Resp: 18 15 19 17   Temp:      TempSrc:      SpO2: 99% 98% 97% 98%   No intake or output data in the 24 hours ending 12/20/20 2255 Last 3 Weights 05/08/2011  Weight (lbs) 216 lb  Weight (kg) 97.977 kg     There is no height or weight on file to calculate BMI.  General:  Well nourished, well developed, in no acute distress HEENT: normal Neck: no JVD Vascular: No carotid bruits; Distal pulses 2+ bilaterally Cardiac:  normal S1, S2; RRR; no murmur  Lungs:  clear to auscultation bilaterally, no wheezing, rhonchi or rales  Abd: soft, nontender, no hepatomegaly  Ext: no edema Musculoskeletal:  No deformities, BUE and BLE strength normal and equal Skin: warm and dry  Neuro:  CNs 2-12 intact, no focal abnormalities noted Psych:  Normal affect   EKG:  The EKG was personally reviewed and demonstrates: NSR  Laboratory Data:  High Sensitivity Troponin:   Recent Labs  Lab 12/20/20 1418 12/20/20 1540  TROPONINIHS 3 2     Chemistry Recent Labs  Lab 12/20/20 1418  NA 137  K 3.3*  CL 97*  CO2 28  GLUCOSE 93  BUN 23*  CREATININE 0.81  CALCIUM 9.3  GFRNONAA >60  ANIONGAP 12    No results for input(s): PROT, ALBUMIN, AST, ALT, ALKPHOS, BILITOT in the last 168 hours. Lipids No results for input(s): CHOL, TRIG, HDL, LABVLDL, LDLCALC, CHOLHDL in the last 168 hours.  Hematology Recent Labs  Lab 12/20/20 1418  WBC 12.4*  RBC 5.02  HGB 14.1  HCT 44.9  MCV 89.4  MCH 28.1  MCHC 31.4  RDW 12.9  PLT 369   Thyroid No results for  input(s): TSH, FREET4 in the last 168 hours.  BNPNo results for input(s): BNP, PROBNP in the last 168 hours.  DDimer No results for input(s): DDIMER in the last 168 hours.   Radiology/Studies:  DG Chest 1 View  Result Date: 12/20/2020 CLINICAL DATA:  Chest pain, shortness of breath EXAM: CHEST  1 VIEW COMPARISON:  None. FINDINGS: Heart size appears within normal limits. Low lung volumes. No focal airspace consolidation, pleural effusion, or pneumothorax. IMPRESSION: Low lung volumes. No acute cardiopulmonary findings. Electronically Signed   By: Duanne Guess D.O.   On: 12/20/2020 14:51   CT Angio Chest PE W and/or Wo Contrast  Result Date: 12/20/2020 CLINICAL DATA:  Pulmonary embolus suspected with high probability. Chest pain and jaw pain starting a few days ago. Shortness of breath. EXAM: CT ANGIOGRAPHY CHEST WITH CONTRAST TECHNIQUE: Multidetector CT imaging of the chest was performed using the standard protocol during bolus administration of intravenous contrast. Multiplanar CT image reconstructions and MIPs were obtained to evaluate the vascular anatomy. CONTRAST:  51mL OMNIPAQUE IOHEXOL 350 MG/ML SOLN COMPARISON:  Chest radiograph 12/20/2020 FINDINGS: Cardiovascular: Suboptimal contrast bolus limits evaluation of the pulmonary arteries. There is moderately good visualization of the central and segmental pulmonary arteries without obvious filling defect. No definitive evidence of significant pulmonary embolus. Normal heart size. No pericardial effusions. Normal caliber thoracic aorta. Great vessel origins are patent. Mediastinum/Nodes: Esophagus is decompressed. No significant lymphadenopathy. Thyroid gland is unremarkable. Lungs/Pleura: Evaluation is limited by motion artifact. No airspace disease or consolidation is suggested. No pleural effusions. No pneumothorax. Upper Abdomen: Prominent fatty infiltration of the liver. No acute abnormalities demonstrated in the visualized upper abdomen.  Musculoskeletal: No chest wall abnormality. No acute or significant osseous findings. Review of the MIP images confirms the above findings. IMPRESSION: No evidence of significant pulmonary embolus on limited contrast bolus. No evidence of active pulmonary disease. Fatty infiltration of the liver. Electronically Signed   By: Burman Nieves M.D.   On: 12/20/2020 20:05     Assessment and Plan:  # Atypical Chest Pain # HTN  -Aspirin 325 in ED followed by Aspirin 81 daily -Hold off on heparin and Plavix for now -Atorvastatin 80 -Echo in AM -Nuclear Stress Test vs LHC in AM -Consider holter at discharge for fluttery feeling.  -Replete K  -Keep NPO at midnight  Signed, Hermelinda Dellen, MD  12/20/2020 10:55 PM

## 2020-12-21 ENCOUNTER — Telehealth: Payer: Self-pay | Admitting: Home Health

## 2020-12-21 ENCOUNTER — Inpatient Hospital Stay (HOSPITAL_COMMUNITY): Payer: 59

## 2020-12-21 ENCOUNTER — Telehealth: Payer: Self-pay | Admitting: *Deleted

## 2020-12-21 DIAGNOSIS — R079 Chest pain, unspecified: Secondary | ICD-10-CM

## 2020-12-21 DIAGNOSIS — E785 Hyperlipidemia, unspecified: Secondary | ICD-10-CM

## 2020-12-21 DIAGNOSIS — I10 Essential (primary) hypertension: Secondary | ICD-10-CM

## 2020-12-21 DIAGNOSIS — K219 Gastro-esophageal reflux disease without esophagitis: Secondary | ICD-10-CM

## 2020-12-21 DIAGNOSIS — R072 Precordial pain: Secondary | ICD-10-CM

## 2020-12-21 LAB — HCG, QUANTITATIVE, PREGNANCY: hCG, Beta Chain, Quant, S: 5 m[IU]/mL — ABNORMAL HIGH (ref <5)

## 2020-12-21 LAB — HEPATIC FUNCTION PANEL
ALT: 59 U/L — ABNORMAL HIGH (ref 0–44)
AST: 29 U/L (ref 15–41)
Albumin: 3.6 g/dL (ref 3.5–5.0)
Alkaline Phosphatase: 86 U/L (ref 38–126)
Bilirubin, Direct: 0.2 mg/dL (ref 0.0–0.2)
Indirect Bilirubin: 1.6 mg/dL — ABNORMAL HIGH (ref 0.3–0.9)
Total Bilirubin: 1.8 mg/dL — ABNORMAL HIGH (ref 0.3–1.2)
Total Protein: 6.8 g/dL (ref 6.5–8.1)

## 2020-12-21 LAB — BASIC METABOLIC PANEL
Anion gap: 11 (ref 5–15)
BUN: 18 mg/dL (ref 6–20)
CO2: 27 mmol/L (ref 22–32)
Calcium: 8.9 mg/dL (ref 8.9–10.3)
Chloride: 95 mmol/L — ABNORMAL LOW (ref 98–111)
Creatinine, Ser: 0.69 mg/dL (ref 0.44–1.00)
GFR, Estimated: >60 mL/min (ref >60)
Glucose, Bld: 105 mg/dL — ABNORMAL HIGH (ref 70–99)
Potassium: 3.2 mmol/L — ABNORMAL LOW (ref 3.5–5.1)
Sodium: 133 mmol/L — ABNORMAL LOW (ref 135–145)

## 2020-12-21 LAB — CBC
HCT: 44.1 % (ref 36.0–46.0)
Hemoglobin: 13.9 g/dL (ref 12.0–15.0)
MCH: 28.5 pg (ref 26.0–34.0)
MCHC: 31.5 g/dL (ref 30.0–36.0)
MCV: 90.4 fL (ref 80.0–100.0)
Platelets: 328 10*3/uL (ref 150–400)
RBC: 4.88 MIL/uL (ref 3.87–5.11)
RDW: 13.1 % (ref 11.5–15.5)
WBC: 12.2 10*3/uL — ABNORMAL HIGH (ref 4.0–10.5)
nRBC: 0 % (ref 0.0–0.2)

## 2020-12-21 LAB — ECHOCARDIOGRAM COMPLETE
Area-P 1/2: 4.68 cm2
S' Lateral: 2.6 cm

## 2020-12-21 LAB — CBG MONITORING, ED: Glucose-Capillary: 132 mg/dL — ABNORMAL HIGH (ref 70–99)

## 2020-12-21 LAB — LIPID PANEL
Cholesterol: 184 mg/dL (ref 0–200)
HDL: 54 mg/dL (ref >40)
LDL Cholesterol: 87 mg/dL (ref 0–99)
Total CHOL/HDL Ratio: 3.4 RATIO
Triglycerides: 215 mg/dL — ABNORMAL HIGH (ref <150)
VLDL: 43 mg/dL — ABNORMAL HIGH (ref 0–40)

## 2020-12-21 LAB — HEMOGLOBIN A1C
Hgb A1c MFr Bld: 5.7 % — ABNORMAL HIGH (ref 4.8–5.6)
Mean Plasma Glucose: 116.89 mg/dL

## 2020-12-21 LAB — HIV ANTIBODY (ROUTINE TESTING W REFLEX): HIV Screen 4th Generation wRfx: NONREACTIVE

## 2020-12-21 LAB — MAGNESIUM: Magnesium: 2 mg/dL (ref 1.7–2.4)

## 2020-12-21 MED ORDER — LOSARTAN POTASSIUM 100 MG PO TABS
100.0000 mg | ORAL_TABLET | Freq: Every day | ORAL | 0 refills | Status: DC
Start: 2020-12-21 — End: 2023-04-11

## 2020-12-21 MED ORDER — ACETAMINOPHEN 325 MG PO TABS
650.0000 mg | ORAL_TABLET | Freq: Once | ORAL | Status: AC
  Administered 2020-12-21: 650 mg via ORAL
  Filled 2020-12-21: qty 2

## 2020-12-21 MED ORDER — ACETAMINOPHEN 325 MG PO TABS: 650.0000 mg | ORAL_TABLET | Freq: Four times a day (QID) | ORAL | Status: DC | PRN

## 2020-12-21 MED ORDER — LOSARTAN POTASSIUM 50 MG PO TABS
50.0000 mg | ORAL_TABLET | Freq: Every day | ORAL | Status: DC
  Administered 2020-12-21: 50 mg via ORAL
  Filled 2020-12-21: qty 1

## 2020-12-21 MED ORDER — POTASSIUM CHLORIDE 20 MEQ PO PACK
40.0000 meq | PACK | Freq: Once | ORAL | Status: AC
  Administered 2020-12-21: 40 meq via ORAL
  Filled 2020-12-21: qty 2

## 2020-12-21 MED ORDER — CHLORTHALIDONE 25 MG PO TABS
25.0000 mg | ORAL_TABLET | Freq: Every day | ORAL | Status: DC
  Administered 2020-12-21: 25 mg via ORAL
  Filled 2020-12-21: qty 1

## 2020-12-21 MED ORDER — PANTOPRAZOLE SODIUM 40 MG PO TBEC
40.0000 mg | DELAYED_RELEASE_TABLET | Freq: Every day | ORAL | Status: DC
  Administered 2020-12-21: 40 mg via ORAL
  Filled 2020-12-21: qty 1

## 2020-12-21 MED ORDER — POTASSIUM CHLORIDE ER 10 MEQ PO TBCR
10.0000 meq | EXTENDED_RELEASE_TABLET | Freq: Every day | ORAL | Status: DC
  Administered 2020-12-21: 10 meq via ORAL
  Filled 2020-12-21 (×2): qty 1

## 2020-12-21 NOTE — Hospital Course (Addendum)
Hospital Course by problem list: [name] is a 50 y.o. female, with a PMHx of HTN, HLD, and no prior history of cardiac disease presented chest pain that radiates to the neck and left arm. LOS: 1 days  ED Course: Chest x-ray and CT angio of the chest were both negative. EKG normal sinus.  Troponins have remained negative and flat. Cardiology has been consulted and does not recommend heparin at this point with plans for cath in the future. Admit for CP r/o.   During hospitalization, patient received extensive ACS and pulmonary embolism work-up, including chest x-ray, CT angio, EKG, hemoglobin A1c, lipid panel, serum magnesium, echocardiogram.  Work-up was negative for both.  Also performed abdominal ultrasound, for possible referred pain secondary to cholelithiasis or -cystitis. This was also negative for cholecystitis and cholelithiasis.  LFTs are pending  Consults: Cardiology  Plan:  #Atypical chest pain Patient presented with symptoms of L-sided jaw pain that radiated to the L chest and L arm, concerned for ACS and PE. Workups were negative. LDL 87, high-sensitivity trops flat.  EKG showed no evidence of ischemia.  Spoke with cardiology, and agrees that presentation is not likely cardiac in etiology.  Echo showed EF 60 to 65%, right and left ventricular systolic and diastolic function is normal without regional wall motion abnormality. MV and AV are normal and structure, no evidence of regurgitation. No ventricular hypertrophy. -We will follow up outpatient for stress echo  #Fatty liver infiltrate Presentation may be 2/2 to referred pain from irritated diaphragm due to possible cholelithiasis v cholecystitis. However, no evidence on abdominal US. Did show moderate fatty infiltration of the liver. - Follow-up LFT   Hyperlipidemia LDL 87. - Continue Lipitor 80 mg.   Hypertension Mild hypokalemia and hyponatremia, likely 2/2 thiazide. Will replete K+. - Continue home  chlorthalidone 25 and losartan 50   Prediabetes Most recent A1c 5.8. Will have patient f/u with PCP outpatient.   GERD Prilosec 20 mg

## 2020-12-21 NOTE — ED Notes (Addendum)
ED TO INPATIENT HANDOFF REPORT  ED Nurse Name and Phone #: Delice Bison, RN [phone]  S Name/Age/Gender Tina Kerr 50 y.o. female Room/Bed: 019C/019C  Code Status   Code Status: Full Code  Home/SNF/Other Home Patient oriented to: self, place, time, and situation Is this baseline? Yes   Triage Complete: Triage complete  Chief Complaint Unstable angina (HCC) [I20.0]  Triage Note Pt reports increase c/p since Sunday 12/18/2020. Pt reports pain radiates done left side.  HX: HTN   Allergies No Known Allergies  Level of Care/Admitting Diagnosis ED Disposition     ED Disposition  Admit   Condition  --   Comment  Hospital Area: [facility] [[id number]]  Level of Care: Telemetry Medical [[id number]]  May admit patient to [facility] or [facility] if equivalent level of care is available:: No  Covid Evaluation: Confirmed COVID Negative  Diagnosis: Unstable angina (HCC) [166847]  Admitting Physician: WILLIAMS, JULIE ANNE [[id number]]  Attending Physician: WILLIAMS, JULIE ANNE [[id number]]  Estimated length of stay: past midnight tomorrow  Certification:: I certify this patient will need inpatient services for at least 2 midnights          B Medical/Surgery History No past medical history on file. Past Surgical History:  Procedure Laterality Date   CESAREAN SECTION       A IV Location/Drains/Wounds Patient Lines/Drains/Airways Status     Active Line/Drains/Airways     Name Placement date Placement time Site Days   Peripheral IV 12/20/20 20 G Left Antecubital 12/20/20  1719  Antecubital  1            Intake/Output Last 24 hours No intake or output data in the 24 hours ending 12/21/20 1407  Labs/Imaging Results for orders placed or performed during the hospital encounter of 12/20/20 (from the past 48 hour(s))  I-Stat Beta hCG blood, ED ([facility], [facility], [facility] only)     Status: Abnormal   Collection Time: 12/20/20  2:09 PM  Result Value Ref Range   I-stat hCG,  quantitative 5.9 (H) <5 mIU/mL   Comment 3            Comment:   GEST. AGE      CONC.  (mIU/mL)   <=1 WEEK        5 - 50     2 WEEKS       50 - 500     3 WEEKS       10 0 - 10,000     4 WEEKS     1,000 - 30,000        FEMALE AND NON-PREGNANT FEMALE:     LESS THAN 5 mIU/mL   Basic metabolic panel     Status: Abnormal   Collection Time: 12/20/20  2:18 PM  Result Value Ref Range   Sodium 137 135 - 145 mmol/L   Potassium 3.3 (L) 3.5 - 5.1 mmol/L   Chloride 97 (L) 98 - 111 mmol/L   CO2 28 22 - 32 mmol/L   Glucose, Bld 93 70 - 99 mg/dL    Comment: Glucose reference range applies only to samples taken after fasting for at least 8 hours.   BUN 23 (H) 6 - 20 mg/dL   Creatinine, Ser 02/19/21 0.44 - 1.00 mg/dL   Calcium 9.3 8.9 - 4.35 mg/dL   GFR, Estimated 68.6 >16 mL/min    Comment: (NOTE) Calculated using the CKD-EPI Creatinine Equation (2021)    Anion gap 12 5 - 15    Comment:  Performed at [facility], [address] [place], [place] [address]  CBC     Status: Abnormal   Collection Time: 12/20/20  2:18 PM  Result Value Ref Range   WBC 12.4 (H) 4.0 - 10.5 K/uL   RBC 5.02 3.87 - 5.11 MIL/uL   Hemoglobin 14.1 12.0 - 15.0 g/dL   HCT 50.9 32.6 - 71.2 %   MCV 89.4 80.0 - 100.0 fL   MCH 28.1 26.0 - 34.0 pg   MCHC 31.4 30.0 - 36.0 g/dL   RDW 45.8 09.9 - 83.3 %   Platelets 369 150 - 400 K/uL   nRBC 0.0 0.0 - 0.2 %    Comment: Performed at [facility], [address] [place], [place] [address]  Troponin I (High Sensitivity)     Status: None   Collection Time: 12/20/20  2:18 PM  Result Value Ref Range   Troponin I (High Sensitivity) 3 <18 ng/L    Comment: (NOTE) Elevated high sensitivity troponin I (hsTnI) values and significant  changes across serial measurements may suggest ACS but many other  chronic and acute conditions are known to elevate hsTnI results.  Refer to the "Links" section for chest pain algorithms and additional  guidance. Performed at [facility]  [facility], [address], [place], [place] [address]   Troponin I (High Sensitivity)     Status: None   Collection Time: 12/20/20  3:40 PM  Result Value Ref Range   Troponin I (High Sensitivity) 2 <18 ng/L    Comment: (NOTE) Elevated high sensitivity troponin I (hsTnI) values and significant  changes across serial measurements may suggest ACS but many other  chronic and acute conditions are known to elevate hsTnI results.  Refer to the "Links" section for chest pain algorithms and additional  guidance. Performed at [facility], [address] [place], [place] [address]   Resp Panel by RT-PCR (Flu A&B, Covid) Nasopharyngeal Swab     Status: None   Collection Time: 12/20/20  5:33 PM   Specimen: Nasopharyngeal Swab; Nasopharyngeal(NP) swabs in vial transport medium  Result Value Ref Range   SARS Coronavirus 2 by RT PCR NEGATIVE NEGATIVE    Comment: (NOTE) SARS-CoV-2 target nucleic acids are NOT DETECTED.  The SARS-CoV-2 RNA is generally detectable in upper respiratory specimens during the acute phase of infection. The lowest concentration of SARS-CoV-2 viral copies this assay can detect is 138 copies/mL. A negative result does not preclude SARS-Cov-2 infection and should not be used as the sole basis for treatment or other patient management decisions. A negative result may occur with  improper specimen collection/handling, submission of specimen other than nasopharyngeal swab, presence of viral mutation(s) within the areas targeted by this assay, and inadequate number of viral copies(<138 copies/mL). A negative result must be combined with clinical observations, patient history, and epidemiological information. The expected result is Negative.  Fact Sheet for Patients:  BloggerCourse.com  Fact Sheet for Healthcare Providers:  SeriousBroker.it  This test is no t yet approved or cleared by the [place] FDA and  has been  authorized for detection and/or diagnosis of SARS-CoV-2 by FDA under an Emergency Use Authorization (EUA). This EUA will remain  in effect (meaning this test can be used) for the duration of the COVID-19 declaration under Section 564(b)(1) of the Act, 21 U.S.C.section 360bbb-3(b)(1), unless the authorization is terminated  or revoked sooner.       Influenza A by PCR NEGATIVE NEGATIVE  Influenza B by PCR NEGATIVE NEGATIVE    Comment: (NOTE) The Xpert Xpress SARS-CoV-2/FLU/RSV plus assay is intended as an aid in the diagnosis of influenza from Nasopharyngeal swab specimens and should not be used as a sole basis for treatment. Nasal washings and aspirates are unacceptable for Xpert Xpress SARS-CoV-2/FLU/RSV testing.  Fact Sheet for Patients: BloggerCourse.com  Fact Sheet for Healthcare Providers: SeriousBroker.it  This test is not yet approved or cleared by the [place] FDA and has been authorized for detection and/or diagnosis of SARS-CoV-2 by FDA under an Emergency Use Authorization (EUA). This EUA will remain in effect (meaning this test can be used) for the duration of the COVID-19 declaration under Section 564(b)(1) of the Act, 21 U.S.C. section 360bbb-3(b)(1), unless the authorization is terminated or revoked.  Performed at [facility], [address], [place], [place] [address]   CBC     Status: Abnormal   Collection Time: 12/21/20  3:53 AM  Result Value Ref Range   WBC 12.2 (H) 4.0 - 10.5 K/uL   RBC 4.88 3.87 - 5.11 MIL/uL   Hemoglobin 13.9 12.0 - 15.0 g/dL   HCT 60.4 54.0 - 98.1 %   MCV 90.4 80.0 - 100.0 fL   MCH 28.5 26.0 - 34.0 pg   MCHC 31.5 30.0 - 36.0 g/dL   RDW 19.1 47.8 - 29.5 %   Platelets 328 150 - 400 K/uL   nRBC 0.0 0.0 - 0.2 %    Comment: Performed at [facility], [address] [place], [place] [address]  Basic metabolic panel     Status: Abnormal   Collection Time: 12/21/20   3:53 AM  Result Value Ref Range   Sodium 133 (L) 135 - 145 mmol/L   Potassium 3.2 (L) 3.5 - 5.1 mmol/L   Chloride 95 (L) 98 - 111 mmol/L   CO2 27 22 - 32 mmol/L   Glucose, Bld 105 (H) 70 - 99 mg/dL    Comment: Glucose reference range applies only to samples taken after fasting for at least 8 hours.   BUN 18 6 - 20 mg/dL   Creatinine, Ser 8.65 0.44 - 1.00 mg/dL   Calcium 8.9 8.9 - 78.4 mg/dL   GFR, Estimated >69 >62 mL/min    Comment: (NOTE) Calculated using the CKD-EPI Creatinine Equation (2021)    Anion gap 11 5 - 15    Comment: Performed at [facility], [address] [place], [place] [address]  Hemoglobin A1c     Status: Abnormal   Collection Time: 12/21/20  3:53 AM  Result Value Ref Range   Hgb A1c MFr Bld 5.7 (H) 4.8 - 5.6 %    Comment: (NOTE) Pre diabetes:          5.7%-6.4%  Diabetes:              >6.4%  Glycemic control for   <7.0% adults with diabetes    Mean Plasma Glucose 116.89 mg/dL    Comment: Performed at [facility], [address] [place], [place] [address]  Lipid panel     Status: Abnormal   Collection Time: 12/21/20  3:53 AM  Result Value Ref Range   Cholesterol 184 0 - 200 mg/dL   Triglycerides 010 (H) <150 mg/dL   HDL 54 >27 mg/dL   Total CHOL/HDL Ratio 3.4 RATIO   VLDL 43 (H) 0 - 40 mg/dL   LDL Cholesterol 87 0 - 99 mg/dL    Comment:  Total Cholesterol/HDL:CHD Risk Coronary Heart Disease Risk Table                     Men   Women  1/2 Average Risk   3.4   3.3  Average Risk       5.0   4.4  2 X Average Risk   9.6   7.1  3 X Average Risk  23.4   11.0        Use the calculated Patient Ratio above and the CHD Risk Table to determine the patient's CHD Risk.        ATP III CLASSIFICATION (LDL):  <100     mg/dL   Optimal  456-256  mg/dL   Near or Above                    Optimal  130-159  mg/dL   Borderline  389-373  mg/dL   High  >428     mg/dL   Very High Performed at [facility], [address]  [place], [place] [address]   Magnesium     Status: None   Collection Time: 12/21/20  3:53 AM  Result Value Ref Range   Magnesium 2.0 1.7 - 2.4 mg/dL    Comment: Performed at [facility], [address], [place], [place] [address]  HIV Antibody (routine testing w rflx)     Status: None   Collection Time: 12/21/20  3:53 AM  Result Value Ref Range   HIV Screen 4th Generation wRfx Non Reactive Non Reactive    Comment: Performed at [facility], [address], [place], [place] [address]  hCG, quantitative, pregnancy     Status: Abnormal   Collection Time: 12/21/20  3:53 AM  Result Value Ref Range   hCG, Beta Chain, Quant, S 5 (H) <5 mIU/mL    Comment:          GEST. AGE      CONC.  (mIU/mL)   <=1 WEEK        5 - 50     2 WEEKS       50 - 500     3 WEEKS       100 - 10,000     4 WEEKS     1,000 - 30,000     5 WEEKS     3,500 - 115,000   6-8 WEEKS     12,000 - 270,000    12 WEEKS     15,000 - 220,000        FEMALE AND NON-PREGNANT FEMALE:     LESS THAN 5 mIU/mL Performed at [facility], [address] [place], [place] [address]   CBG monitoring, ED     Status: Abnormal   Collection Time: 12/21/20  8:50 AM  Result Value Ref Range   Glucose-Capillary 132 (H) 70 - 99 mg/dL    Comment: Glucose reference range applies only to samples taken after fasting for at least 8 hours.   DG Chest 1 View  Result Date: 12/20/2020 CLINICAL DATA:  Chest pain, shortness of breath EXAM: CHEST  1 VIEW COMPARISON:  None. FINDINGS: Heart size appears within normal limits. Low lung volumes. No focal airspace consolidation, pleural effusion, or pneumothorax. IMPRESSION: Low lung volumes. No acute cardiopulmonary findings. Electronically Signed   By: Duanne Guess D.O.   On: 12/20/2020 14:51   CT Angio Chest PE W and/or Wo Contrast  Result Date: 12/20/2020 CLINICAL DATA:  Pulmonary embolus suspected with high probability. Chest pain and jaw pain starting a few days ago. Shortness of breath.  EXAM: CT ANGIOGRAPHY CHEST WITH CONTRAST TECHNIQUE: Multidetector CT imaging of the chest was performed using the standard protocol during bolus administration of intravenous contrast. Multiplanar CT image reconstructions and MIPs were obtained to evaluate the vascular anatomy. CONTRAST:  54mL OMNIPAQUE IOHEXOL 350 MG/ML SOLN COMPARISON:  Chest radiograph 12/20/2020 FINDINGS: Cardiovascular: Suboptimal contrast bolus limits evaluation of the pulmonary arteries. There is moderately good visualization of the central and segmental pulmonary arteries without obvious filling defect. No definitive evidence of significant pulmonary embolus. Normal heart size. No pericardial effusions. Normal caliber thoracic aorta. Great vessel origins are patent. Mediastinum/Nodes: Esophagus is decompressed. No significant lymphadenopathy. Thyroid gland is unremarkable. Lungs/Pleura: Evaluation is limited by motion artifact. No airspace disease or consolidation is suggested. No pleural effusions. No pneumothorax. Upper Abdomen: Prominent fatty infiltration of the liver. No acute abnormalities demonstrated in the visualized upper abdomen. Musculoskeletal: No chest wall abnormality. No acute or significant osseous findings. Review of the MIP images confirms the above findings. IMPRESSION: No evidence of significant pulmonary embolus on limited contrast bolus. No evidence of active pulmonary disease. Fatty infiltration of the liver. Electronically Signed   By: Burman Nieves M.D.   On: 12/20/2020 20:05   US Abdomen Limited RUQ (LIVER/GB)  Result Date: 12/21/2020 CLINICAL DATA:  Abdominal and chest pain EXAM: ULTRASOUND ABDOMEN LIMITED RIGHT UPPER QUADRANT COMPARISON:  None. FINDINGS: Gallbladder: No gallstones or wall thickening visualized. No sonographic Murphy sign noted by sonographer. Common bile duct: Diameter: 6 mm Liver: Parenchymal echogenicity is diffusely increased likely reflecting moderate fatty infiltration. There are no  focal lesions. Portal vein is patent on color Doppler imaging with normal direction of blood flow towards the liver. Other: None. IMPRESSION: 1. Moderate fatty infiltration of the liver. 2. No evidence of cholelithiasis or acute cholecystitis. Electronically Signed   By: Lesia Hausen M.D.   On: 12/21/2020 10:35    Pending Labs Unresulted Labs (From admission, onward)     Start     Ordered   12/21/20 0848  Hepatic function panel  Add-on,   AD        12/21/20 0847            Vitals/Pain Today's Vitals   12/21/20 0800 12/21/20 0900 12/21/20 1200 12/21/20 1300  BP: 125/88 118/80 136/90 (!) 131/98  Pulse: 94 88 76 95  Resp: 17 16 19 18   Temp:      TempSrc:      SpO2: 97% 97% 98% 98%  PainSc:        Isolation Precautions No active isolations  Medications Medications  nitroGLYCERIN (NITROSTAT) SL tablet 0.4 mg (has no administration in time range)  aspirin tablet 325 mg (325 mg Oral Given 12/21/20 1026)  sodium chloride flush (NS) 0.9 % injection 3 mL (3 mLs Intravenous Not Given 12/21/20 1028)  atorvastatin (LIPITOR) tablet 80 mg (80 mg Oral Given 12/21/20 1027)  losartan (COZAAR) tablet 50 mg (50 mg Oral Given 12/21/20 1026)  chlorthalidone (HYGROTON) tablet 25 mg (25 mg Oral Given 12/21/20 1025)  potassium chloride (KLOR-CON) CR tablet 10 mEq (10 mEq Oral Given 12/21/20 1026)  pantoprazole (PROTONIX) EC tablet 40 mg (40 mg Oral Given 12/21/20 1027)  iohexol (OMNIPAQUE) 350 MG/ML injection 80 mL (80 mLs Intravenous Contrast Given 12/20/20 1947)  potassium chloride (KLOR-CON) packet 40 mEq (40 mEq Oral Given 12/21/20 0037)    Mobility  walks Low fall risk   Focused Assessments Cardiac Assessment Handoff:  Cardiac Rhythm: Normal sinus rhythm No results found for: CKTOTAL, CKMB, CKMBINDEX, TROPONINI No results found for: DDIMER Does the Patient currently have chest pain? No    R Recommendations: See Admitting Provider Note  Report given to: Marylene Land, RN  Additional  Notes:  Pt ambulates, on room air, pain free all day.

## 2020-12-21 NOTE — Discharge Summary (Signed)
Name: Tina Kerr  MRN: [id number] DOB: April 03, 1970 50 y.o. PCP: Patient, No Pcp Per (Inactive)  Date of Admission: 12/20/2020  1:43 PM Date of Discharge: 12/21/2020 Attending Physician: Miguel Aschoff, MD  Discharge Diagnosis: Atypical chest pain Fatty liver infiltrate Hyperlipidemia Hypertension Prediabetes GERD  Active Problems:   Unstable angina [facility])  Discharge Medications: Allergies as of 12/21/2020   No Known Allergies      Medication List     STOP taking these medications    chlorthalidone 25 MG tablet Commonly known as: HYGROTON   lisinopril-hydrochlorothiazide 20-12.5 MG tablet Commonly known as: Prinzide       TAKE these medications    levonorgestrel 20 MCG/24HR IUD Commonly known as: MIRENA 1 each by Intrauterine route once.   losartan 100 MG tablet Commonly known as: COZAAR Take 1 tablet (100 mg total) by mouth daily. What changed:  medication strength how much to take   omeprazole 20 MG capsule Commonly known as: PRILOSEC Take 20 mg by mouth daily.   potassium chloride 10 MEQ tablet Commonly known as: KLOR-CON Take 10 mEq by mouth daily.   RED YEAST RICE PO Take 1 capsule by mouth daily.        Disposition and follow-up:   Ms.Tina Kerr was discharged from [facility] in Stable condition. At the hospital follow up visit please address:  Atypical chest pain.  Negative ACS and pulmonary embolism work-up Stress echo outpatient Fatty liver infiltrate Abdominal ultrasound revealed no evidence of cholelithiasis or cholecystitis Hyperlipidemia LDL 87 Hypertension On chlorthalidone 25 mg losartan 50 mg daily Prediabetes Hemoglobin A1c 5.8 GERD Prilosec 20 mg  Labs / imaging needed at time of follow-up: None Pending labs/ test needing follow-up: LFT  Follow-up Appointments:  Follow-up Information     Azalee Course, [place] Follow up on 01/06/2021.   Specialties: Cardiology, Radiology Why: at  11:15AM for cardiology follow up Contact information: [address] [place] [address] [phone]                 Hospital Course by problem list: [name] is a 50 y.o. female, with a PMHx of HTN, HLD, and no prior history of cardiac disease presented chest pain that radiates to the neck and left arm. LOS: 1 days  ED Course: Chest x-ray and CT angio of the chest were both negative. EKG normal sinus.  Troponins have remained negative and flat. Cardiology has been consulted and does not recommend heparin at this point with plans for cath in the future. Admit for CP r/o.   During hospitalization, patient received extensive ACS and pulmonary embolism work-up, including chest x-ray, CT angio, EKG, hemoglobin A1c, lipid panel, serum magnesium, echocardiogram.  Work-up was negative for both.  Also performed abdominal ultrasound, for possible referred pain secondary to cholelithiasis or -cystitis. This was also negative for cholecystitis and cholelithiasis.  LFTs are pending  Consults: Cardiology  Plan:  #Atypical chest pain Patient presented with symptoms of L-sided jaw pain that radiated to the L chest and L arm, concerned for ACS and PE. Workups were negative. LDL 87, high-sensitivity trops flat.  EKG showed no evidence of ischemia.  Spoke with cardiology, and agrees that presentation is not likely cardiac in etiology.  Echo showed EF 60 to 65%, right and left ventricular systolic and diastolic function is normal without regional wall motion abnormality. MV and AV are normal and structure, no evidence of regurgitation. No ventricular hypertrophy. -We will follow up outpatient for stress  echo  #Fatty liver infiltrate Presentation may be 2/2 to referred pain from irritated diaphragm due to possible cholelithiasis v cholecystitis. However, no evidence on abdominal US. Did show moderate fatty infiltration of the liver. - Follow-up LFT   Hyperlipidemia LDL  87. - Continue Lipitor 80 mg.   Hypertension Mild hypokalemia and hyponatremia, likely 2/2 thiazide. Will replete K+. - Continue home chlorthalidone 25 and losartan 50   Prediabetes Most recent A1c 5.8. Will have patient f/u with PCP outpatient.   GERD Prilosec 20 mg   Discharge Exam:   BP 113/69 (BP Location: Right Arm)   Pulse 90   Temp 98.4 F (36.9 C) (Oral)   Resp 20   LMP  (LMP Unknown)   SpO2 97%   Physical Exam Vitals and nursing note reviewed.  HENT:     Head: Normocephalic and atraumatic.  Cardiovascular:     Rate and Rhythm: Normal rate and regular rhythm.     Heart sounds: Normal heart sounds. No murmur heard. Pulmonary:     Effort: Pulmonary effort is normal.     Breath sounds: Normal breath sounds. No wheezing or rales.  Abdominal:     Palpations: Abdomen is soft.  Musculoskeletal:     Right lower leg: No edema.     Left lower leg: No edema.  Skin:    General: Skin is warm and dry.  Neurological:     Mental Status: She is alert.  Psychiatric:        Mood and Affect: Mood normal.        Behavior: Behavior normal.     Pertinent Labs, Studies, and Procedures:  Lab Results  Component Value Date   HGBA1C 5.7 (H) 12/21/2020    Lab Results  Component Value Date   CHOL 184 12/21/2020   HDL 54 12/21/2020   LDLCALC 87 12/21/2020   TRIG 215 (H) 12/21/2020   CHOLHDL 3.4 12/21/2020     DG Chest 1 View  Result Date: 12/20/2020 CLINICAL DATA:  Chest pain, shortness of breath EXAM: CHEST  1 VIEW COMPARISON:  None. FINDINGS: Heart size appears within normal limits. Low lung volumes. No focal airspace consolidation, pleural effusion, or pneumothorax. IMPRESSION: Low lung volumes. No acute cardiopulmonary findings. Electronically Signed   By: Duanne Guess D.O.   On: 12/20/2020 14:51   CT Angio Chest PE W and/or Wo Contrast  Result Date: 12/20/2020 CLINICAL DATA:  Pulmonary embolus suspected with high probability. Chest pain and jaw pain starting a  few days ago. Shortness of breath. EXAM: CT ANGIOGRAPHY CHEST WITH CONTRAST TECHNIQUE: Multidetector CT imaging of the chest was performed using the standard protocol during bolus administration of intravenous contrast. Multiplanar CT image reconstructions and MIPs were obtained to evaluate the vascular anatomy. CONTRAST:  48mL OMNIPAQUE IOHEXOL 350 MG/ML SOLN COMPARISON:  Chest radiograph 12/20/2020 FINDINGS: Cardiovascular: Suboptimal contrast bolus limits evaluation of the pulmonary arteries. There is moderately good visualization of the central and segmental pulmonary arteries without obvious filling defect. No definitive evidence of significant pulmonary embolus. Normal heart size. No pericardial effusions. Normal caliber thoracic aorta. Great vessel origins are patent. Mediastinum/Nodes: Esophagus is decompressed. No significant lymphadenopathy. Thyroid gland is unremarkable. Lungs/Pleura: Evaluation is limited by motion artifact. No airspace disease or consolidation is suggested. No pleural effusions. No pneumothorax. Upper Abdomen: Prominent fatty infiltration of the liver. No acute abnormalities demonstrated in the visualized upper abdomen. Musculoskeletal: No chest wall abnormality. No acute or significant osseous findings. Review of the MIP images confirms the above  findings. IMPRESSION: No evidence of significant pulmonary embolus on limited contrast bolus. No evidence of active pulmonary disease. Fatty infiltration of the liver. Electronically Signed   By: Burman Nieves M.D.   On: 12/20/2020 20:05   ECHOCARDIOGRAM COMPLETE  Result Date: 12/21/2020    ECHOCARDIOGRAM REPORT   Patient Name:   AREATHA KALATA Date of Exam: 12/21/2020 Medical Rec #:  [id number]        Height:       64.5 in Accession #:    [id number]       Weight:       216.0 lb Date of Birth:  05-01-1970        BSA:          2.032 m Patient Age:    50 years         BP:           131/98 mmHg Patient Gender: F                HR:            85 bpm. Exam Location:  Inpatient Procedure: 2D Echo Indications:    chest pain  History:        Patient has no prior history of Echocardiogram examinations.  Sonographer:    Delcie Roch RDCS Referring Phys: [phone] Keryl Gholson ANNE Mayford Knife  Sonographer Comments: Patient is morbidly obese and suboptimal subcostal window. Image acquisition challenging due to patient body habitus. IMPRESSIONS  1. Left ventricular ejection fraction, by estimation, is 60 to 65%. The left ventricle has normal function. The left ventricle has no regional wall motion abnormalities. Left ventricular diastolic parameters were normal.  2. Right ventricular systolic function is normal. The right ventricular size is normal.  3. The mitral valve is normal in structure. No evidence of mitral valve regurgitation. No evidence of mitral stenosis.  4. The aortic valve is normal in structure. Aortic valve regurgitation is not visualized. No aortic stenosis is present.  5. The inferior vena cava is normal in size with greater than 50% respiratory variability, suggesting right atrial pressure of 3 mmHg. FINDINGS  Left Ventricle: Left ventricular ejection fraction, by estimation, is 60 to 65%. The left ventricle has normal function. The left ventricle has no regional wall motion abnormalities. The left ventricular internal cavity size was normal in size. There is  no left ventricular hypertrophy. Left ventricular diastolic parameters were normal. Right Ventricle: The right ventricular size is normal. No increase in right ventricular wall thickness. Right ventricular systolic function is normal. Left Atrium: Left atrial size was normal in size. Right Atrium: Right atrial size was normal in size. Pericardium: There is no evidence of pericardial effusion. Mitral Valve: The mitral valve is normal in structure. No evidence of mitral valve regurgitation. No evidence of mitral valve stenosis. Tricuspid Valve: The tricuspid valve is normal in structure.  Tricuspid valve regurgitation is not demonstrated. No evidence of tricuspid stenosis. Aortic Valve: The aortic valve is normal in structure. Aortic valve regurgitation is not visualized. No aortic stenosis is present. Pulmonic Valve: The pulmonic valve was normal in structure. Pulmonic valve regurgitation is not visualized. No evidence of pulmonic stenosis. Aorta: The aortic root is normal in size and structure. Venous: The inferior vena cava is normal in size with greater than 50% respiratory variability, suggesting right atrial pressure of 3 mmHg. IAS/Shunts: The interatrial septum was not well visualized.  LEFT VENTRICLE PLAX 2D LVIDd:         4.50 cm  Diastology LVIDs:         2.60 cm   LV e' medial:    6.64 cm/s LV PW:         1.10 cm   LV E/e' medial:  10.4 LV IVS:        1.00 cm   LV e' lateral:   10.00 cm/s LVOT diam:     1.90 cm   LV E/e' lateral: 6.9 LV SV:         43 LV SV Index:   21 LVOT Area:     2.84 cm  RIGHT VENTRICLE RV S prime:     13.30 cm/s TAPSE (M-mode): 2.2 cm LEFT ATRIUM             Index        RIGHT ATRIUM           Index LA diam:        3.30 cm 1.62 cm/m   RA Area:     10.50 cm LA Vol (A2C):   30.4 ml 14.96 ml/m  RA Volume:   20.10 ml  9.89 ml/m LA Vol (A4C):   39.6 ml 19.49 ml/m LA Biplane Vol: 36.3 ml 17.86 ml/m  AORTIC VALVE LVOT Vmax:   94.00 cm/s LVOT Vmean:  60.200 cm/s LVOT VTI:    0.150 m  AORTA Ao Root diam: 2.80 cm Ao Asc diam:  3.20 cm MITRAL VALVE MV Area (PHT): 4.68 cm    SHUNTS MV Decel Time: 162 msec    Systemic VTI:  0.15 m MV E velocity: 69.00 cm/s  Systemic Diam: 1.90 cm MV A velocity: 60.90 cm/s MV E/A ratio:  1.13 Charlton Haws MD Electronically signed by Charlton Haws MD Signature Date/Time: 12/21/2020/3:41:36 PM    Final    US Abdomen Limited RUQ (LIVER/GB)  Result Date: 12/21/2020 CLINICAL DATA:  Abdominal and chest pain EXAM: ULTRASOUND ABDOMEN LIMITED RIGHT UPPER QUADRANT COMPARISON:  None. FINDINGS: Gallbladder: No gallstones or wall thickening  visualized. No sonographic Murphy sign noted by sonographer. Common bile duct: Diameter: 6 mm Liver: Parenchymal echogenicity is diffusely increased likely reflecting moderate fatty infiltration. There are no focal lesions. Portal vein is patent on color Doppler imaging with normal direction of blood flow towards the liver. Other: None. IMPRESSION: 1. Moderate fatty infiltration of the liver. 2. No evidence of cholelithiasis or acute cholecystitis. Electronically Signed   By: Lesia Hausen M.D.   On: 12/21/2020 10:35     Signed: Princess Bruins, DO Psychiatry Resident, PGY-1 [facility] 12/21/2020, 5:50 PM   Pager: [phone]

## 2020-12-21 NOTE — Telephone Encounter (Signed)
Cyndi Bender, NP  P Cv Div Nl Triage Hi   Can you help schedule this patient for an outpatient non-urgent NM stress Myovue per Dr Elsworth Soho request ? Thanks   Chapman Fitch       Order placed and sent to scheduling to arrange.

## 2020-12-21 NOTE — Progress Notes (Signed)
HD 1 SUBJECTIVE:  Patient Summary: Tina Kerr is a 50 y.o. female, with a PMHx of HTN, HLD, and no prior history of cardiac disease presented chest pain that radiates to the neck and left arm.  Overnight Events: None  Interim History: Tina Kerr was seen this AM. She has not had any additional episodes of chest pain or fluttering. She is very concerned about her risk of heart disease. She denies any new medications or supplements prior to chest pain onset.   She describes pleuritic chest pain that is worse with sitting up. It only occurs occasionally. Location of pain is on the left side along the ribs and under her left breast. She feels a bit winded when this pain occurs.    No nausea, vomiting, diarrhea, constipation, abdominal pain, abdominal bloating, trouble swallowing.   At work, she does a lot of walking. She does not experience any DOE or chest pain with exertion.   States that the initial pain occurred after eating a peanut butter waffle on Sunday. She denies other episodes of post-prandial pain or diarrhea. Denies cholecystectomy.  Also reports that she was recently on ketogenic diet to lower her LDLs.   Benny Lennert PA is her PCP with last visit 6 month ago for HTN follow up. Office is located at [facility] on [address]. Appt scheduled for 12/29/2020 for routine follow up.    OBJECTIVE:  Vital Signs: Vitals:   12/21/20 0800 12/21/20 0900 12/21/20 1200 12/21/20 1300  BP: 125/88 118/80 136/90 (!) 131/98  Pulse: 94 88 76 95  Resp: 17 16 19 18   Temp:      TempSrc:      SpO2: 97% 97% 98% 98%   Supplemental O2:  Room Air SpO2: 98 % There were no vitals filed for this visit. No intake or output data in the 24 hours ending 12/21/20 1342 Physical Exam: Physical Exam Vitals and nursing note reviewed.  Constitutional:      General: She is not in acute distress.    Appearance: She is obese. She is not toxic-appearing or diaphoretic.  HENT:     Head: Normocephalic  and atraumatic.  Cardiovascular:     Rate and Rhythm: Normal rate and regular rhythm.     Pulses: Normal pulses.          Dorsalis pedis pulses are 2+ on the right side and 2+ on the left side.     Heart sounds: Normal heart sounds. No murmur heard.   No friction rub.  Pulmonary:     Effort: Pulmonary effort is normal.     Breath sounds: Normal breath sounds. No rhonchi or rales.  Chest:     Chest wall: No tenderness.  Abdominal:     General: There is no distension.     Tenderness: There is no abdominal tenderness. There is no guarding.  Musculoskeletal:     Right lower leg: No edema.     Left lower leg: No edema.  Skin:    General: Skin is warm and dry.  Neurological:     General: No focal deficit present.     Mental Status: She is alert and oriented to person, place, and time.  Psychiatric:        Mood and Affect: Mood normal. Mood is not anxious.        Behavior: Behavior normal. Behavior is not agitated.   Pertinent Labs: CBC Latest Ref Rng & Units 12/21/2020 12/20/2020  WBC 4.0 - 10.5 K/uL 12.2(H) 12.4(H)  Hemoglobin 12.0 - 15.0 g/dL 39.7 67.3  Hematocrit 41.9 - 46.0 % 44.1 44.9  Platelets 150 - 400 K/uL 328 369   CMP Latest Ref Rng & Units 12/21/2020 12/20/2020 05/08/2011  Glucose 70 - 99 mg/dL 379(K) 93 84  BUN 6 - 20 mg/dL 18 24(O) 19  Creatinine 0.44 - 1.00 mg/dL 9.73 5.32 9.92  Sodium 135 - 145 mmol/L 133(L) 137 137  Potassium 3.5 - 5.1 mmol/L 3.2(L) 3.3(L) 4.4  Chloride 98 - 111 mmol/L 95(L) 97(L) 101  CO2 22 - 32 mmol/L 27 28 26   Calcium 8.9 - 10.3 mg/dL 8.9 9.3 9.3   Recent Labs    12/21/20 0850  GLUCAP 132*   Pertinent Imaging: DG Chest 1 View  Result Date: 12/20/2020 CLINICAL DATA:  Chest pain, shortness of breath EXAM: CHEST  1 VIEW COMPARISON:  None. FINDINGS: Heart size appears within normal limits. Low lung volumes. No focal airspace consolidation, pleural effusion, or pneumothorax. IMPRESSION: Low lung volumes. No acute cardiopulmonary findings.  Electronically Signed   By: 02/19/2021 D.O.   On: 12/20/2020 14:51   CT Angio Chest PE W and/or Wo Contrast  Result Date: 12/20/2020 CLINICAL DATA:  Pulmonary embolus suspected with high probability. Chest pain and jaw pain starting a few days ago. Shortness of breath. EXAM: CT ANGIOGRAPHY CHEST WITH CONTRAST TECHNIQUE: Multidetector CT imaging of the chest was performed using the standard protocol during bolus administration of intravenous contrast. Multiplanar CT image reconstructions and MIPs were obtained to evaluate the vascular anatomy. CONTRAST:  57mL OMNIPAQUE IOHEXOL 350 MG/ML SOLN COMPARISON:  Chest radiograph 12/20/2020 FINDINGS: Cardiovascular: Suboptimal contrast bolus limits evaluation of the pulmonary arteries. There is moderately good visualization of the central and segmental pulmonary arteries without obvious filling defect. No definitive evidence of significant pulmonary embolus. Normal heart size. No pericardial effusions. Normal caliber thoracic aorta. Great vessel origins are patent. Mediastinum/Nodes: Esophagus is decompressed. No significant lymphadenopathy. Thyroid gland is unremarkable. Lungs/Pleura: Evaluation is limited by motion artifact. No airspace disease or consolidation is suggested. No pleural effusions. No pneumothorax. Upper Abdomen: Prominent fatty infiltration of the liver. No acute abnormalities demonstrated in the visualized upper abdomen. Musculoskeletal: No chest wall abnormality. No acute or significant osseous findings. Review of the MIP images confirms the above findings. IMPRESSION: No evidence of significant pulmonary embolus on limited contrast bolus. No evidence of active pulmonary disease. Fatty infiltration of the liver. Electronically Signed   By: 02/19/2021 M.D.   On: 12/20/2020 20:05   02/19/2021 Abdomen Limited RUQ (LIVER/GB)  Result Date: 12/21/2020 CLINICAL DATA:  Abdominal and chest pain EXAM: ULTRASOUND ABDOMEN LIMITED RIGHT UPPER QUADRANT  COMPARISON:  None. FINDINGS: Gallbladder: No gallstones or wall thickening visualized. No sonographic Murphy sign noted by sonographer. Common bile duct: Diameter: 6 mm Liver: Parenchymal echogenicity is diffusely increased likely reflecting moderate fatty infiltration. There are no focal lesions. Portal vein is patent on color Doppler imaging with normal direction of blood flow towards the liver. Other: None. IMPRESSION: 1. Moderate fatty infiltration of the liver. 2. No evidence of cholelithiasis or acute cholecystitis. Electronically Signed   By: 02/20/2021 M.D.   On: 12/21/2020 10:35   ASSESSMENT/PLAN:  Assessment: Active Problems:   Unstable angina [facility])  [name] is a 50 y.o. female, with a PMHx of HTN, HLD, and no prior history of cardiac disease presented chest pain that radiates to the neck and left arm. HD 1  Plan: #Atypical chest pain Patient presented with symptoms of L-sided jaw  pain that radiated to the L chest and L arm, concerned for ACS and PE. Workups were negative. LDL 87, high-sensitivity trops flat. Spoke with cardiology, and agrees that presentation is not likely cardiac in etiology. Will continue with ECHO, and possible stress ECHO. - Follow-up echocardiogram to assess LV function - Continue cardiac monitoring - Cardiology has been consulted, appreciate recs  #Fatty liver infiltrate Presentation may be 2/2 to referred pain from irritated diaphragm due to possible cholelithiasis v cholecystitis. However, no evidence on abdominal US. Did show moderate fatty infiltration of the liver. - Follow-up  LFT  Hyperlipidemia LDL 87. - Continue Lipitor 80 mg.   Hypertension Mild hypokalemia and hyponatremia, likely 2/2 thiazide. Will replete K+. - Continue home chlorthalidone 25 and losartan 50   Prediabetes Most recent A1c 5.8. Will have patient f/u with PCP outpatient.   GERD Prilosec 20 mg  Best Practice: Diet: Heart Healthy IVF:   None,None VTE: Place  and maintain sequential compression device Start: 12/20/20 2304  Holding chemical for possible cath tomorrow Code: Full Code  PT/OT recs: Pending, none. TOC recs: Pending  Prior to Admission Living Arrangement: Home, living with family Anticipated Discharge Location: Home Barriers to Discharge: Pending medical stability  Dispo: Anticipated discharge in 1-2 days.  Signature: Princess Bruins, DO Psychiatry Resident, PGY-1 [facility] Pager: [phone] 1:42 PM, 12/21/2020  Please contact the on call pager after 5 pm and on weekends at [phone].

## 2020-12-21 NOTE — Progress Notes (Signed)
  Echocardiogram 2D Echocardiogram has been performed.  Delcie Roch 12/21/2020, 3:18 PM

## 2020-12-21 NOTE — Progress Notes (Signed)
Pt arrived to unit from ED , A/O x 4,  CCMD called ,CHG given, pt oriented to unit,Will continue to monitor.   Tina Christmas Khaleelah Yowell, RN    12/21/20 1522  Vitals  Temp 98.4 F (36.9 C)  Temp Source Oral  BP 113/69  MAP (mmHg) 83  BP Location Right Arm  BP Method Automatic  Patient Position (if appropriate) Lying  Pulse Rate 90  Pulse Rate Source Monitor  ECG Heart Rate 89  Resp 20  Level of Consciousness  Level of Consciousness Alert  Oxygen Therapy  SpO2 97 %  O2 Device Room Air  O2 Flow Rate (L/min) 0 L/min  ECG Monitoring  Cardiac Rhythm NSR  Telemetry Box Number [id number]  Tele Box Verification Completed by Second Verifier Completed  Pain Assessment  Pain Scale 0-10  Pain Score 2  Pain Location Chest  PCA/Epidural/Spinal Assessment  Respiratory Pattern Regular;Unlabored  Glasgow Coma Scale  Eye Opening 4  Best Verbal Response (NON-intubated) 5  Best Motor Response 6  Glasgow Coma Scale Score 15  MEWS Score  MEWS Temp 0  MEWS Systolic 0  MEWS Pulse 0  MEWS RR 0  MEWS LOC 0  MEWS Score 0  MEWS Score Color Green

## 2020-12-21 NOTE — Progress Notes (Addendum)
Progress Note  Patient Name: Tina Kerr Date of Encounter: 12/21/2020  [facility] Cardiologist: New to Dr Salena Saner  Subjective   Patient reports right-sided jaw radiating into in sternum/diaphragm started on Sunday 12/18/20, she was laying in bed, pain lasted half an hour and it was sharp, 9/10 in intensity, she was pale and short of breath.  Pain resolved spontaneously.  She went to work yesterday 10/11, and 9/10 with intermittent precordial chest pain radiating to left arm, pain feels like cramping, lasting seconds each episode.  She denies any current chest pain, denied any syncope, paresthesia, nausea,vomiting, diarrhea. She reports grandfather had multiple MIs at age of 44s, no personal hx of MI, CAD, CVA.   Inpatient Medications    Scheduled Meds:  aspirin  325 mg Oral Daily   atorvastatin  80 mg Oral Daily   chlorthalidone  25 mg Oral Daily   losartan  50 mg Oral Daily   pantoprazole  40 mg Oral Daily   potassium chloride  10 mEq Oral Daily   sodium chloride flush  3 mL Intravenous Q12H   Continuous Infusions:  PRN Meds: nitroGLYCERIN   Vital Signs    Vitals:   12/20/20 2300 12/20/20 2330 12/21/20 0230 12/21/20 0600  BP: 133/88 (!) 145/110 112/80 119/84  Pulse: 92 100 96 81  Resp: (!) 21 (!) 22 19 18   Temp:      TempSrc:      SpO2: 98% 100% 97% 96%   No intake or output data in the 24 hours ending 12/21/20 0851 Last 3 Weights 05/08/2011  Weight (lbs) 216 lb  Weight (kg) 97.977 kg      Telemetry    Sinus rhythm 90s - Personally Reviewed  ECG    EKG at admission 12/20/20 with SR, T wave flattening of V5-6,  - Personally Reviewed  Physical Exam   GEN: No acute distress. Obese  Neck: No JVD Cardiac: RRR, no murmurs, rubs, or gallops.  Respiratory: Clear to auscultation bilaterally.On room air  GI: Soft, nontender, non-distended  MS: Trace BLE edema; No deformity. Neuro:  Nonfocal  Psych: Normal affect   Labs    High Sensitivity Troponin:    Recent Labs  Lab 12/20/20 1418 12/20/20 1540  TROPONINIHS 3 2     Chemistry Recent Labs  Lab 12/20/20 1418 12/21/20 0353  NA 137 133*  K 3.3* 3.2*  CL 97* 95*  CO2 28 27  GLUCOSE 93 105*  BUN 23* 18  CREATININE 0.81 0.69  CALCIUM 9.3 8.9  MG  --  2.0  GFRNONAA >60 >60  ANIONGAP 12 11    Lipids  Recent Labs  Lab 12/21/20 0353  CHOL 184  TRIG 215*  HDL 54  LDLCALC 87  CHOLHDL 3.4    Hematology Recent Labs  Lab 12/20/20 1418 12/21/20 0353  WBC 12.4* 12.2*  RBC 5.02 4.88  HGB 14.1 13.9  HCT 44.9 44.1  MCV 89.4 90.4  MCH 28.1 28.5  MCHC 31.4 31.5  RDW 12.9 13.1  PLT 369 328   Thyroid No results for input(s): TSH, FREET4 in the last 168 hours.  BNPNo results for input(s): BNP, PROBNP in the last 168 hours.  DDimer No results for input(s): DDIMER in the last 168 hours.   Radiology    DG Chest 1 View  Result Date: 12/20/2020 CLINICAL DATA:  Chest pain, shortness of breath EXAM: CHEST  1 VIEW COMPARISON:  None. FINDINGS: Heart size appears within normal limits. Low lung volumes. No focal  airspace consolidation, pleural effusion, or pneumothorax. IMPRESSION: Low lung volumes. No acute cardiopulmonary findings. Electronically Signed   By: Duanne Guess D.O.   On: 12/20/2020 14:51   CT Angio Chest PE W and/or Wo Contrast  Result Date: 12/20/2020 CLINICAL DATA:  Pulmonary embolus suspected with high probability. Chest pain and jaw pain starting a few days ago. Shortness of breath. EXAM: CT ANGIOGRAPHY CHEST WITH CONTRAST TECHNIQUE: Multidetector CT imaging of the chest was performed using the standard protocol during bolus administration of intravenous contrast. Multiplanar CT image reconstructions and MIPs were obtained to evaluate the vascular anatomy. CONTRAST:  49mL OMNIPAQUE IOHEXOL 350 MG/ML SOLN COMPARISON:  Chest radiograph 12/20/2020 FINDINGS: Cardiovascular: Suboptimal contrast bolus limits evaluation of the pulmonary arteries. There is moderately good  visualization of the central and segmental pulmonary arteries without obvious filling defect. No definitive evidence of significant pulmonary embolus. Normal heart size. No pericardial effusions. Normal caliber thoracic aorta. Great vessel origins are patent. Mediastinum/Nodes: Esophagus is decompressed. No significant lymphadenopathy. Thyroid gland is unremarkable. Lungs/Pleura: Evaluation is limited by motion artifact. No airspace disease or consolidation is suggested. No pleural effusions. No pneumothorax. Upper Abdomen: Prominent fatty infiltration of the liver. No acute abnormalities demonstrated in the visualized upper abdomen. Musculoskeletal: No chest wall abnormality. No acute or significant osseous findings. Review of the MIP images confirms the above findings. IMPRESSION: No evidence of significant pulmonary embolus on limited contrast bolus. No evidence of active pulmonary disease. Fatty infiltration of the liver. Electronically Signed   By: Burman Nieves M.D.   On: 12/20/2020 20:05    Cardiac Studies   Echo pending   Patient Profile     50 y.o. female with PMH of HTN, HLD, GERD, OA, obesity, pre-diabetes,  cardiology is following for chest pain since 12/20/20.   Assessment & Plan    Chest pain - presented with right sided jaw pain radiating to neck and chest pain on Sunday 12/18/20 , non-exertional, with SOB; recurrent intermittent cramp chest pain 12/20/20 at work with lightheadedness   - HS trop 3 >2  - EKG with SR 91 bpm, non-specific T wave flattening of V-5-6 comparing to 07/02/11  - CXR no acute finding   - CTA chest negative for PE  - ACS ruled out - Echo pending  - Risk factor including HTN, HLD, pre-diabetes, FH  - Recommend Ischaemic evaluation with outpatient NM stress myovue if Echo unremarkable today  - continue ASA, lipitor for now, final rec to follow based on workup  Hypokalemia  - K 3.2 , Mag WNL,  replace per IM - likely chlorthalidone induced, may DC on high  dose K supplement   HTN - BP fairly controlled on home med chlorthalidone and losartan, may DC on high dose K supplement   HLD - LDL 87  - on lipitor 80mg    Pre-diabetes  - A1C 5.7%, AMCC diet      For questions or updates, please contact [facility] Please consult [email or web address] for contact info under        Signed, , NP  12/21/2020, 8:51 AM    I have seen and examined the patient along with 02/20/2021, NP .  I have reviewed the chart, notes and new data.  I agree with PA/NP's note.  Key new complaints: Recurrent chest pain radiating to jaw and left arm, consistently occurring at rest and not worsened by activity.  She exercises regularly and walks 13,000 steps a day and never has exertional symptoms. Key examination changes:  Morbidly obese, but otherwise normal cardiovascular exam. Key new findings / data: Low risk ECG and biomarkers LDL 87, HDL 54, triglycerides 215 on statin therapy.  Borderline elevated hemoglobin A1c at 5.7% and fasting glucose 105.  Review of the chest CT angiogram (pulmonary embolism protocol) shows absence of any atherosclerotic calcifications in the aorta, great arch vessels or coronary arteries.  PLAN: Low level of suspicion for coronary insufficiency as a cause of her complaints.  Waiting for echocardiogram.  Would recommend outpatient treadmill stress test.  Explore possible GI etiology for her symptoms. Would recommend increasing the dose of losartan and decreasing the dose of chlorthalidone to maintain good blood pressure control, while hopefully preventing future problems with hypokalemia.  Thurmon Fair, MD, [facility] [phone] 12/21/2020, 9:07 AM

## 2020-12-21 NOTE — Progress Notes (Addendum)
Pt discharged from unit. Medication/discharge instruction given. MD aware pt is driving self home and okayed it  Everlean Cherry, RN

## 2020-12-22 NOTE — Telephone Encounter (Signed)
error 

## 2021-01-06 ENCOUNTER — Other Ambulatory Visit: Payer: Self-pay

## 2021-01-06 ENCOUNTER — Ambulatory Visit (INDEPENDENT_AMBULATORY_CARE_PROVIDER_SITE_OTHER): Payer: 59 | Admitting: Physician Assistant

## 2021-01-06 VITALS — BP 126/84 | HR 88 | Ht 65.0 in | Wt 252.8 lb

## 2021-01-06 DIAGNOSIS — E876 Hypokalemia: Secondary | ICD-10-CM

## 2021-01-06 DIAGNOSIS — I10 Essential (primary) hypertension: Secondary | ICD-10-CM

## 2021-01-06 DIAGNOSIS — R079 Chest pain, unspecified: Secondary | ICD-10-CM | POA: Diagnosis not present

## 2021-01-06 NOTE — Patient Instructions (Addendum)
Medication Instructions:  Your physician recommends that you continue on your current medications as directed. Please refer to the Current Medication list given to you today.  *If you need a refill on your cardiac medications before your next appointment, please call your pharmacy*  Lab Work: NONE ordered at this time of appointment   If you have labs (blood work) drawn today and your tests are completely normal, you will receive your results only by: MyChart Message (if you have MyChart) OR A paper copy in the mail If you have any lab test that is abnormal or we need to change your treatment, we will call you to review the results.  Testing/Procedures: NONE ordered at this time of appointment   Previously ETT order placed and needs scheduling   Follow-Up: At [facility], you and your health needs are our priority.  As part of our continuing mission to provide you with exceptional heart care, we have created designated Provider Care Teams.  These Care Teams include your primary Cardiologist (physician) and Advanced Practice Providers (APPs -  Physician Assistants and Nurse Practitioners) who all work together to provide you with the care you need, when you need it.  Your next appointment:   3 month(s)  The format for your next appointment:   In Person  Provider:   Thurmon Fair, MD  Other Instructions

## 2021-01-06 NOTE — Progress Notes (Signed)
Cardiology Office Note:    Date:  01/08/2021   ID:  Tina Kerr, DOB 25-Nov-1970, MRN [id number]  PCP:  Larkin Ina   [facility] Providers Cardiologist:  Thurmon Fair, MD     Referring MD: No ref. provider found   Chief Complaint  Patient presents with   Follow-up    Seen for Dr. Royann Shivers    History of Present Illness:    Tina Kerr is a 50 y.o. female with a hx of hypertension who was recently admitted to the hospital on 12/20/2020 for evaluation of chest pain.  CTA of the chest was negative for PE.Serial troponin was negative x2.  Patient was seen by Dr. Royann Shivers, review of CTA images showed absence of any atherosclerotic calcification in the aorta or coronary arteries.  Her chest pain was somewhat atypical.  Echocardiogram obtained on 12/21/2020 showed EF 60 to 65%, no regional wall motion abnormality, no significant valve issue.  Patient has great exercise ability.  It was recommended for the patient to proceed with outpatient treadmill stress test for further evaluation.  She did have hypokalemia during the hospitalization, Dr. Royann Shivers recommended increasing the dose of losartan while decreasing the dose of chlorthalidone to avoid hypokalemia.  Patient presents today for follow-up.  She denies any significant chest discomfort.  Talking with Dr. Royann Shivers, initial recommendations to proceed with plain old treadmill stress test, not a nuclear stress test.  She can follow-up with Dr. Royann Shivers in 3 months after her ETT, if normal, she likely can be released from the cardiology service.  She says she went back on chlorthalidone despite instruction to discontinue it from the hospital, she says it took her a long time to find a combination of medication that can control her blood pressure.  She contacted her PCP who is planning to bring her back to the office to discuss medication adjustment.  I will defer further blood pressure management and hypokalemia monitoring to  PCP.  Patient did mention that she had a repeat blood work since discharge and her potassium has normalized at her PCPs office.   History reviewed. No pertinent past medical history.  Past Surgical History:  Procedure Laterality Date   CESAREAN SECTION      Current Medications: Current Meds  Medication Sig   chlorthalidone (HYGROTON) 25 MG tablet Take 1 tablet by mouth daily.   levonorgestrel (MIRENA) 20 MCG/24HR IUD 1 each by Intrauterine route once.   losartan (COZAAR) 100 MG tablet Take 1 tablet (100 mg total) by mouth daily.   omeprazole (PRILOSEC) 20 MG capsule Take 20 mg by mouth daily.   potassium chloride (KLOR-CON) 10 MEQ tablet Take 10 mEq by mouth daily.   Red Yeast Rice Extract (RED YEAST RICE PO) Take 1 capsule by mouth daily.     Allergies:   Patient has no known allergies.   Social History   Socioeconomic History   Marital status: Married    Spouse name: Not on file   Number of children: Not on file   Years of education: Not on file   Highest education level: Not on file  Occupational History   Not on file  Tobacco Use   Smoking status: Former    Types: Cigarettes    Quit date: 04/07/2011    Years since quitting: 9.7   Smokeless tobacco: Not on file  Substance and Sexual Activity   Alcohol use: Not on file   Drug use: Not on file   Sexual activity: Not on file  Other Topics Concern   Not on file  Social History Narrative   Not on file   Social Determinants of Health   Financial Resource Strain: Not on file  Food Insecurity: Not on file  Transportation Needs: Not on file  Physical Activity: Not on file  Stress: Not on file  Social Connections: Not on file     Family History: The patient's family history is not on file.  ROS:   Please see the history of present illness.     All other systems reviewed and are negative.  EKGs/Labs/Other Studies Reviewed:    The following studies were reviewed today:  Echo 12/21/2020  1. Left ventricular  ejection fraction, by estimation, is 60 to 65%. The  left ventricle has normal function. The left ventricle has no regional  wall motion abnormalities. Left ventricular diastolic parameters were  normal.   2. Right ventricular systolic function is normal. The right ventricular  size is normal.   3. The mitral valve is normal in structure. No evidence of mitral valve  regurgitation. No evidence of mitral stenosis.   4. The aortic valve is normal in structure. Aortic valve regurgitation is  not visualized. No aortic stenosis is present.   5. The inferior vena cava is normal in size with greater than 50%  respiratory variability, suggesting right atrial pressure of 3 mmHg.   EKG:  EKG is ordered today.  The ekg ordered today demonstrates normal sinus rhythm, no significant ST-T wave changes.  Recent Labs: 12/21/2020: ALT 59; BUN 18; Creatinine, Ser 0.69; Hemoglobin 13.9; Magnesium 2.0; Platelets 328; Potassium 3.2; Sodium 133  Recent Lipid Panel    Component Value Date/Time   CHOL 184 12/21/2020 0353   TRIG 215 (H) 12/21/2020 0353   HDL 54 12/21/2020 0353   CHOLHDL 3.4 12/21/2020 0353   VLDL 43 (H) 12/21/2020 0353   LDLCALC 87 12/21/2020 0353     Risk Assessment/Calculations:           Physical Exam:    VS:  BP 126/84   Pulse 88   Ht 5\' 5"  (1.651 m)   Wt 252 lb 12.8 oz (114.7 kg)   LMP  (LMP Unknown)   SpO2 96%   BMI 42.07 kg/m     Wt Readings from Last 3 Encounters:  01/06/21 252 lb 12.8 oz (114.7 kg)  05/08/11 216 lb (98 kg)     GEN:  Well nourished, well developed in no acute distress HEENT: Normal NECK: No JVD; No carotid bruits LYMPHATICS: No lymphadenopathy CARDIAC: RRR, no murmurs, rubs, gallops RESPIRATORY:  Clear to auscultation without rales, wheezing or rhonchi  ABDOMEN: Soft, non-tender, non-distended MUSCULOSKELETAL:  No edema; No deformity  SKIN: Warm and dry NEUROLOGIC:  Alert and oriented x 3 PSYCHIATRIC:  Normal affect   ASSESSMENT:    1.  Chest pain of uncertain etiology   2. Primary hypertension   3. Hypokalemia    PLAN:    In order of problems listed above:  Atypical chest pain: Dr. 05/10/11 recommended procedure was exercise tolerance test to further evaluate.  Hypertension: Blood pressure is well controlled on current therapy.  Unfortunately, she did not adhere to the recommendation by the hospitalist and has continued to take chlorthalidone.  She says she has upcoming visit with her PCP who will further adjust her medication.  I will defer further medication adjustment to her primary care provider  Hypokalemia: This was attributed to the chlorthalidone she was taking, chlorthalidone was supposed to be discontinued  from the hospital, however patient admits that she has continued on the chlorthalidone due to her fear that her blood pressure will be out of control again.  She mentioned that it took a long time for her primary care provider to find the right combination for her.  She notified her PCP regarding her decision to continue with her old medication despite recommendation otherwise by the hospital doctor, her PCP has obtained blood work since discharge that demonstrated normalization of potassium level and plans to see her soon in the office to further adjust her medication.         Medication Adjustments/Labs and Tests Ordered: Current medicines are reviewed at length with the patient today.  Concerns regarding medicines are outlined above.  Orders Placed This Encounter  Procedures   EKG 12-Lead   No orders of the defined types were placed in this encounter.   Patient Instructions  Medication Instructions:  Your physician recommends that you continue on your current medications as directed. Please refer to the Current Medication list given to you today.  *If you need a refill on your cardiac medications before your next appointment, please call your pharmacy*  Lab Work: NONE ordered at this time of  appointment   If you have labs (blood work) drawn today and your tests are completely normal, you will receive your results only by: MyChart Message (if you have MyChart) OR A paper copy in the mail If you have any lab test that is abnormal or we need to change your treatment, we will call you to review the results.  Testing/Procedures: NONE ordered at this time of appointment   Previously ETT order placed and needs scheduling   Follow-Up: At [facility], you and your health needs are our priority.  As part of our continuing mission to provide you with exceptional heart care, we have created designated Provider Care Teams.  These Care Teams include your primary Cardiologist (physician) and Advanced Practice Providers (APPs -  Physician Assistants and Nurse Practitioners) who all work together to provide you with the care you need, when you need it.  Your next appointment:   3 month(s)  The format for your next appointment:   In Person  Provider:   Thurmon Fair, MD  Other Instructions    Signed, Azalee Course, [place]  01/08/2021 10:47 PM    [facility]

## 2021-01-08 ENCOUNTER — Encounter: Payer: Self-pay | Admitting: Physician Assistant

## 2021-01-13 ENCOUNTER — Telehealth (HOSPITAL_COMMUNITY): Payer: Self-pay | Admitting: *Deleted

## 2021-01-13 NOTE — Telephone Encounter (Signed)
Close encounter 

## 2021-01-17 ENCOUNTER — Ambulatory Visit (HOSPITAL_COMMUNITY)
Admission: RE | Admit: 2021-01-17 | Discharge: 2021-01-17 | Disposition: A | Discharge status: Home / Self Care | Payer: 59 | Source: Ambulatory Visit | Attending: Cardiology | Admitting: Cardiology

## 2021-01-17 ENCOUNTER — Other Ambulatory Visit: Payer: Self-pay

## 2021-01-17 DIAGNOSIS — R079 Chest pain, unspecified: Secondary | ICD-10-CM | POA: Insufficient documentation

## 2021-01-18 LAB — EXERCISE TOLERANCE TEST
Angina Index: 1
Base ST Depression (mm): 0 mm
Duke Treadmill Score: 1
Estimated workload: 6.6
Exercise duration (min): 4 min
Exercise duration (sec): 43 s
MPHR: 170 {beats}/min
Peak HR: 153 {beats}/min
Percent HR: 90 %
Rest HR: 86 {beats}/min
ST Depression (mm): 0 mm

## 2021-10-16 ENCOUNTER — Emergency Department (HOSPITAL_BASED_OUTPATIENT_CLINIC_OR_DEPARTMENT_OTHER)
Admission: EM | Admit: 2021-10-16 | Discharge: 2021-10-16 | Disposition: A | Discharge status: Home / Self Care | Payer: BC Managed Care – PPO | Attending: Emergency Medicine | Admitting: Emergency Medicine

## 2021-10-16 ENCOUNTER — Other Ambulatory Visit: Payer: Self-pay

## 2021-10-16 ENCOUNTER — Encounter (HOSPITAL_BASED_OUTPATIENT_CLINIC_OR_DEPARTMENT_OTHER): Payer: Self-pay

## 2021-10-16 DIAGNOSIS — S91202A Unspecified open wound of left great toe with damage to nail, initial encounter: Secondary | ICD-10-CM | POA: Insufficient documentation

## 2021-10-16 DIAGNOSIS — W231XXA Caught, crushed, jammed, or pinched between stationary objects, initial encounter: Secondary | ICD-10-CM | POA: Insufficient documentation

## 2021-10-16 DIAGNOSIS — S91209A Unspecified open wound of unspecified toe(s) with damage to nail, initial encounter: Secondary | ICD-10-CM

## 2021-10-16 DIAGNOSIS — S99922A Unspecified injury of left foot, initial encounter: Secondary | ICD-10-CM | POA: Diagnosis present

## 2021-10-16 MED ORDER — BUPIVACAINE HCL 0.5 % IJ SOLN
50.0000 mL | Freq: Once | INTRAMUSCULAR | Status: AC
  Administered 2021-10-16: 50 mL
  Filled 2021-10-16: qty 1

## 2021-10-16 NOTE — Discharge Instructions (Signed)
Return if any problems.

## 2021-10-16 NOTE — ED Triage Notes (Signed)
Patient here POV from Home.  Endorses a Piece of Furniture lifting the Nail from the Right Large Toe approximately 2 Hours ago.   NAD Noted during Triage. A&Ox4. GCS 15. Ambulatory.

## 2021-10-17 NOTE — ED Provider Notes (Signed)
[facility] EMERGENCY DEPT Provider Note   CSN: [id number] Arrival date & time: 10/16/21  1805     History  Chief Complaint  Patient presents with   Toe Injury    Tina Kerr is a 51 y.o. female.  Pt reports she moved furniture and caught her toenail and pulled nail almost off  The history is provided by the patient. No language interpreter was used.  Toe Pain This is a new problem. The current episode started 1 to 2 hours ago. The problem occurs constantly. Nothing aggravates the symptoms. Nothing relieves the symptoms.       Home Medications Prior to Admission medications   Medication Sig Start Date End Date Taking? Authorizing Provider  chlorthalidone (HYGROTON) 25 MG tablet Take 1 tablet by mouth daily. 12/26/20   [provider]  levonorgestrel (MIRENA) 20 MCG/24HR IUD 1 each by Intrauterine route once.    [provider]  losartan (COZAAR) 100 MG tablet Take 1 tablet (100 mg total) by mouth daily. 12/21/20   Verdene Lennert, MD  omeprazole (PRILOSEC) 20 MG capsule Take 20 mg by mouth daily.    [provider]  potassium chloride (KLOR-CON) 10 MEQ tablet Take 10 mEq by mouth daily. 09/28/20   [provider]  Red Yeast Rice Extract (RED YEAST RICE PO) Take 1 capsule by mouth daily.    [provider]  lisinopril (PRINIVIL,ZESTRIL) 10 MG tablet Take 1 tablet (10 mg total) by mouth daily. 05/08/11 11/23/11  Mayans, Assunta Gambles, MD      Allergies    Patient has no known allergies.    Review of Systems   Review of Systems  All other systems reviewed and are negative.   Physical Exam Updated Vital Signs BP 96/67   Pulse 86   Temp 98.5 F (36.9 C)   Resp 20   Ht 5\' 5"  (1.651 m)   Wt 114.7 kg   SpO2 99%   BMI 42.08 kg/m  Physical Exam Vitals and nursing note reviewed.  Constitutional:      Appearance: She is well-developed.  HENT:     Head: Normocephalic.  Pulmonary:     Effort: Pulmonary effort is  normal.  Abdominal:     General: There is no distension.  Musculoskeletal:     Cervical back: Normal range of motion.     Comments: Nail pulled up right 1st toe   Skin:    General: Skin is warm.  Neurological:     Mental Status: She is alert and oriented to person, place, and time.     ED Results / Procedures / Treatments   Labs (all labs ordered are listed, but only abnormal results are displayed) Labs Reviewed - No data to display  EKG None  Radiology No results found.  Procedures .Nail Removal  Date/Time: 10/17/2021 11:51 PM  Performed by: 12/17/2021, PA-C Authorized by: Elson Areas, PA-C   Consent:    Consent obtained:  Verbal   Consent given by:  Patient   Risks, benefits, and alternatives were discussed: yes     Alternatives discussed:  No treatment Universal protocol:    Immediately prior to procedure, a time out was called: yes     Patient identity confirmed:  Verbally with patient Location:    Foot:  L big toe Anesthesia:    Anesthesia method:  Nerve block   Block needle gauge:  25 G   Block anesthetic:  Bupivacaine 0.25% w/o epi Nail Removal:  Nail removed:  Complete Post-procedure details:    Dressing:  Xeroform gauze   Procedure completion:  Tolerated well, no immediate complications     Medications Ordered in ED Medications  bupivacaine (MARCAINE) 0.5 % (with pres) injection 50 mL (50 mLs Infiltration Given 10/16/21 2150)    ED Course/ Medical Decision Making/ A&P                           Medical Decision Making Risk Prescription drug management.           Final Clinical Impression(s) / ED Diagnoses Final diagnoses:  Nail avulsion of toe, initial encounter    Rx / DC Orders ED Discharge Orders     None      An After Visit Summary was printed and given to the patient.    Osie Cheeks 10/17/21 2352    Glynn Octave, MD 10/18/21 [phone]

## 2022-05-16 ENCOUNTER — Other Ambulatory Visit (HOSPITAL_BASED_OUTPATIENT_CLINIC_OR_DEPARTMENT_OTHER): Payer: Self-pay

## 2022-05-16 MED ORDER — MOUNJARO 10 MG/0.5ML ~~LOC~~ SOAJ
SUBCUTANEOUS | 2 refills | Status: DC
  Filled 2022-05-16: qty 2, 28d supply, fill #0
  Filled 2022-06-08: qty 2, 28d supply, fill #1
  Filled 2022-07-06: qty 2, 28d supply, fill #2

## 2022-06-24 IMAGING — CT CT ANGIO CHEST
2 of 6 series · 18 of 46 positions shown · IV contrast (omnipaque)
Comparison: Chest radiograph 12/20/2020

CLINICAL DATA: Pulmonary embolus suspected with high probability.
Chest pain and jaw pain starting a few days ago. Shortness of
breath.

EXAM:
CT ANGIOGRAPHY CHEST WITH CONTRAST
TECHNIQUE: Multidetector CT imaging of the chest was performed using the
standard protocol during bolus administration of intravenous
contrast. Multiplanar CT image reconstructions and MIPs were
obtained to evaluate the vascular anatomy.
CONTRAST:  80mL OMNIPAQUE IOHEXOL 350 MG/ML SOLN

[Series 6: thins · axial · 0.78mm/px · z∈[+1186,+1422]mm · 15 of 260 slices shown]
[im 12/260  lung]
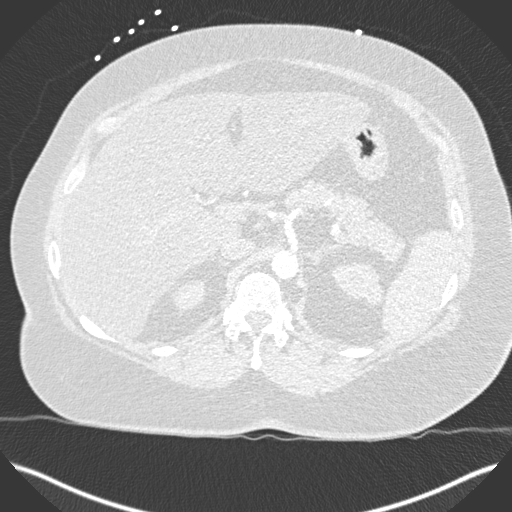
[im 34/260  soft-tissue]
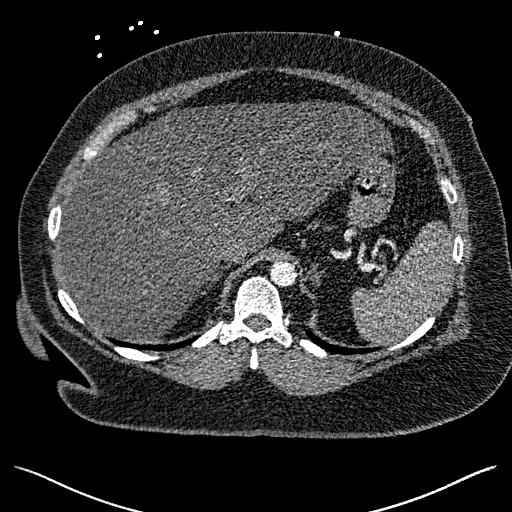
[im 46/260  lung]
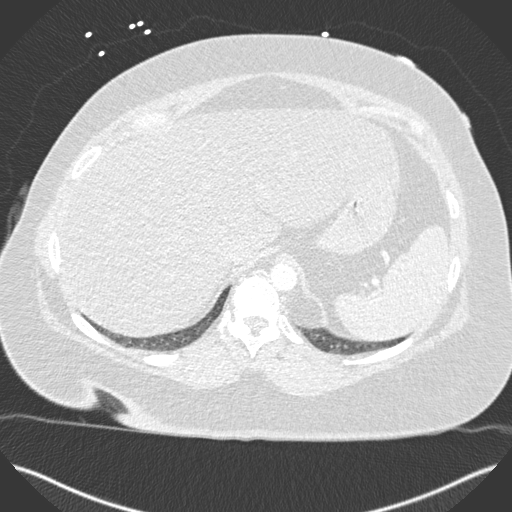
[im 68/260  soft-tissue]
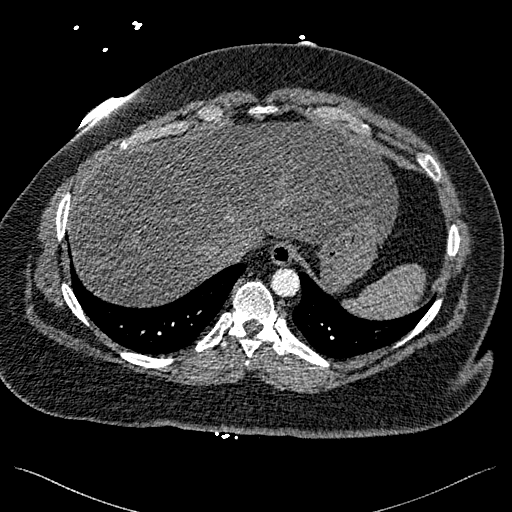
[im 79/260  lung]
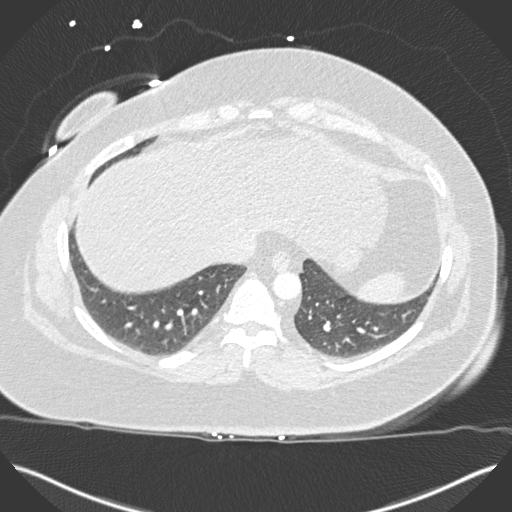
[im 102/260  soft-tissue]
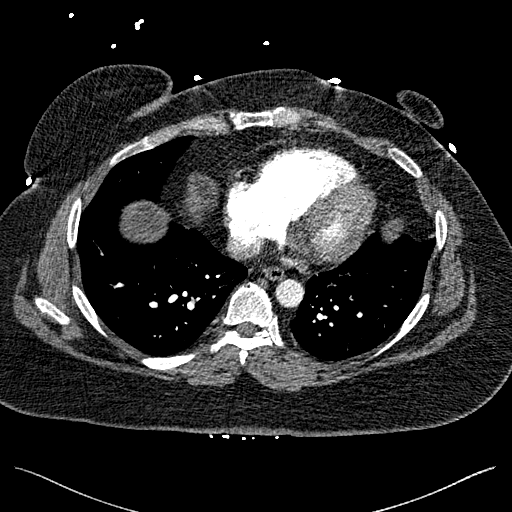
[im 113/260  lung]
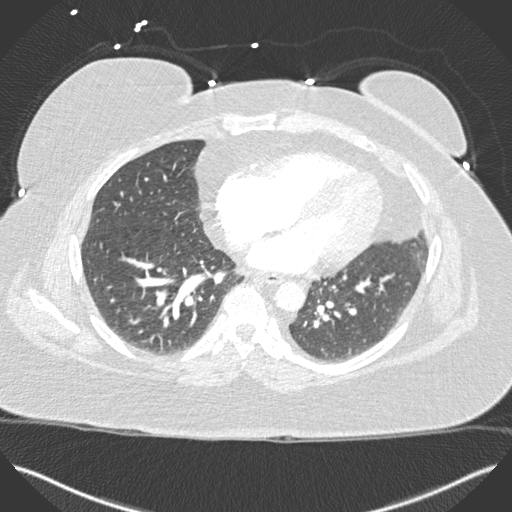
[im 136/260  soft-tissue]
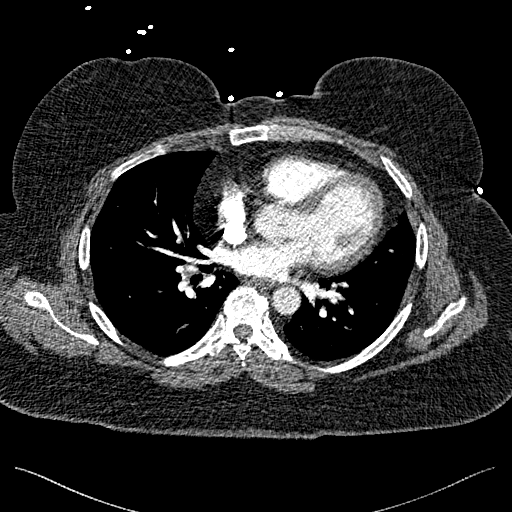
[im 147/260  lung]
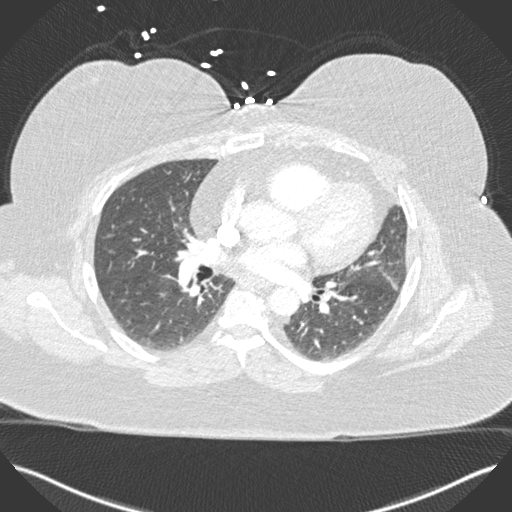
[im 158/260  soft-tissue]
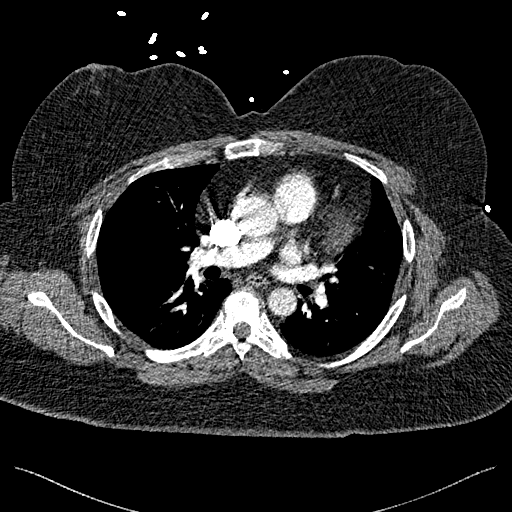
[im 181/260  lung]
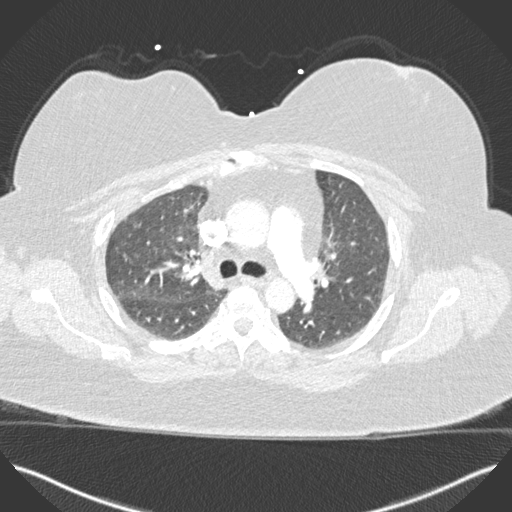
[im 192/260  soft-tissue]
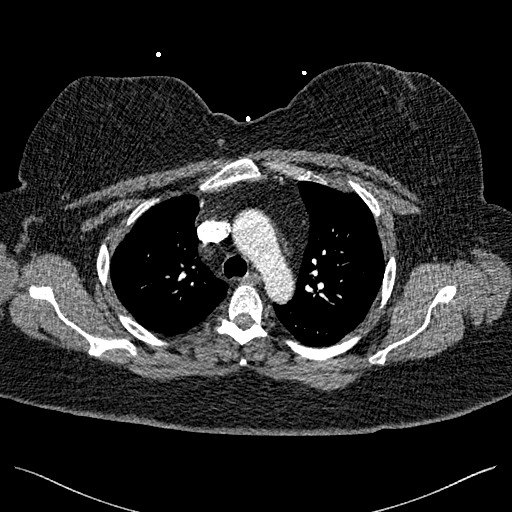
[im 214/260  lung]
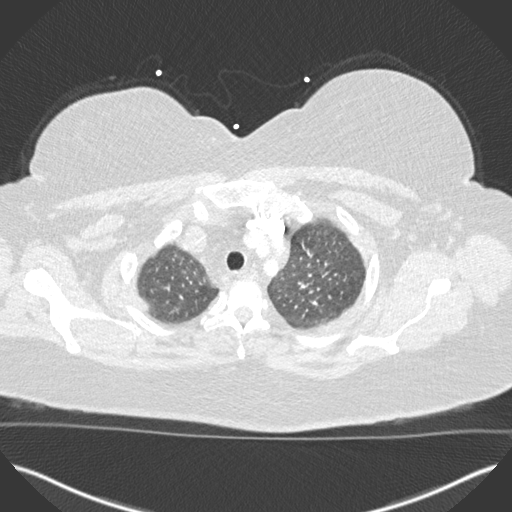
[im 226/260  soft-tissue]
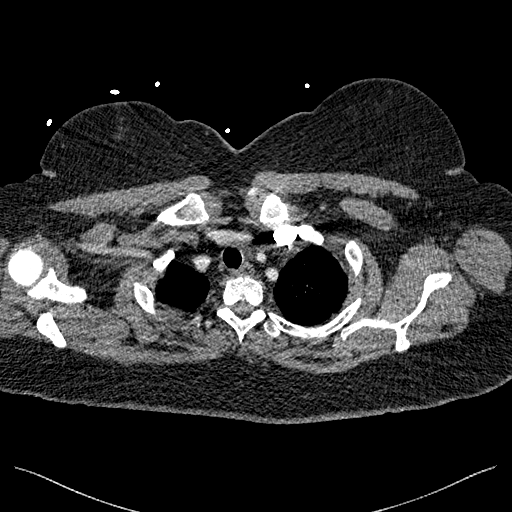
[im 248/260  lung]
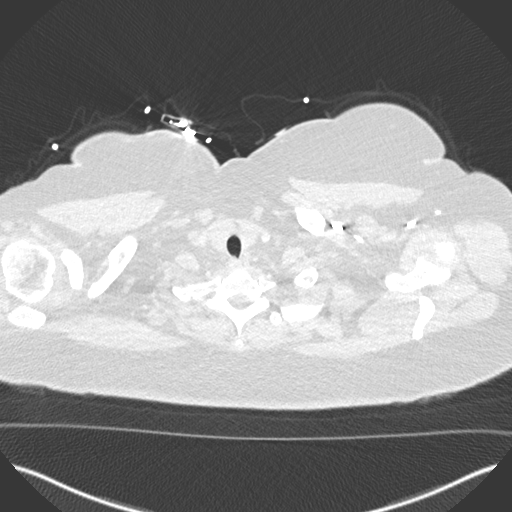

[Series 8: coronal mpr · coronal · 0.52mm/px · 3 of 151 slices shown]
[im 38/151  soft-tissue]
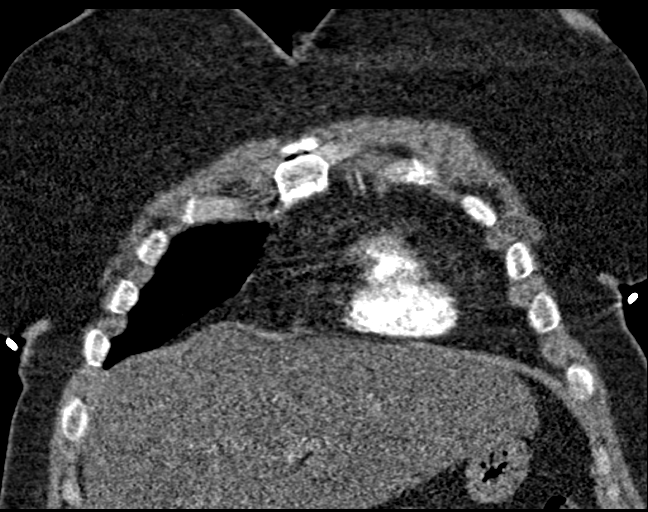
[im 76/151  soft-tissue]
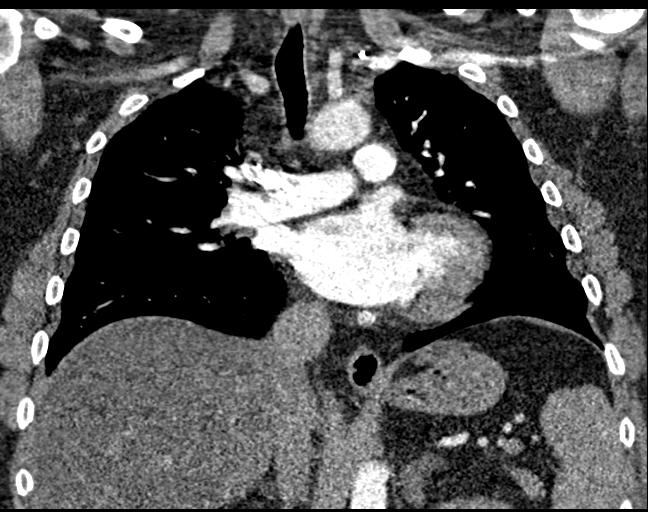
[im 113/151  soft-tissue]
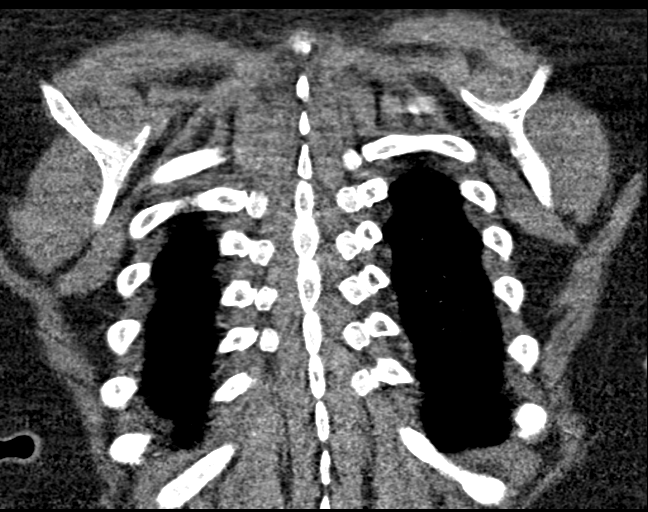

[18 of 46 positions shown; findings below may reference images not displayed]

FINDINGS: Cardiovascular: Suboptimal contrast bolus limits evaluation of the
pulmonary arteries. There is moderately good visualization of the
central and segmental pulmonary arteries without obvious filling
defect. No definitive evidence of significant pulmonary embolus.
Normal heart size. No pericardial effusions. Normal caliber thoracic
aorta. Great vessel origins are patent.

Mediastinum/Nodes: Esophagus is decompressed. No significant
lymphadenopathy. Thyroid gland is unremarkable.

Lungs/Pleura: Evaluation is limited by motion artifact. No airspace
disease or consolidation is suggested. No pleural effusions. No
pneumothorax.

Upper Abdomen: Prominent fatty infiltration of the liver. No acute
abnormalities demonstrated in the visualized upper abdomen.

Musculoskeletal: No chest wall abnormality. No acute or significant
osseous findings.

Review of the MIP images confirms the above findings.
IMPRESSION: No evidence of significant pulmonary embolus on limited contrast
bolus. No evidence of active pulmonary disease. Fatty infiltration
of the liver.

## 2022-06-25 IMAGING — US US ABDOMEN LIMITED
1 series · 14 of 25 positions shown · non-contrast
Comparison: None.

CLINICAL DATA: Abdominal and chest pain

EXAM:
ULTRASOUND ABDOMEN LIMITED RIGHT UPPER QUADRANT

[Series 1: us abdomen limited ruq (liver/gb) · 14 of 73 slices shown]
[im 1/73]
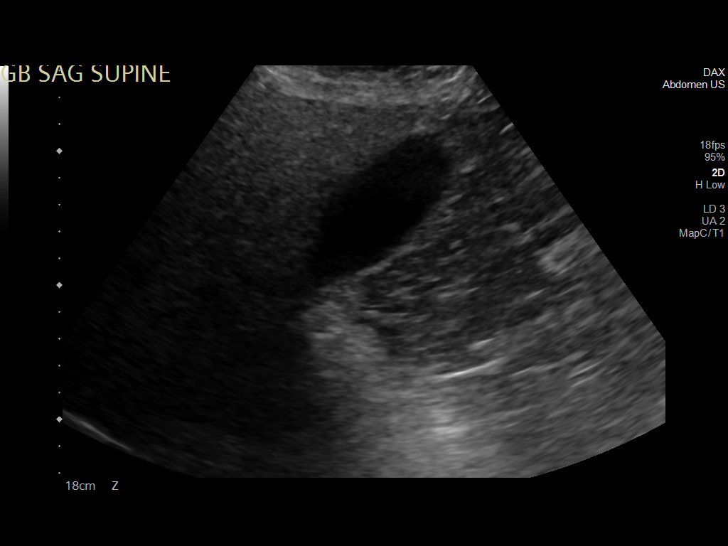
[im 7/73]
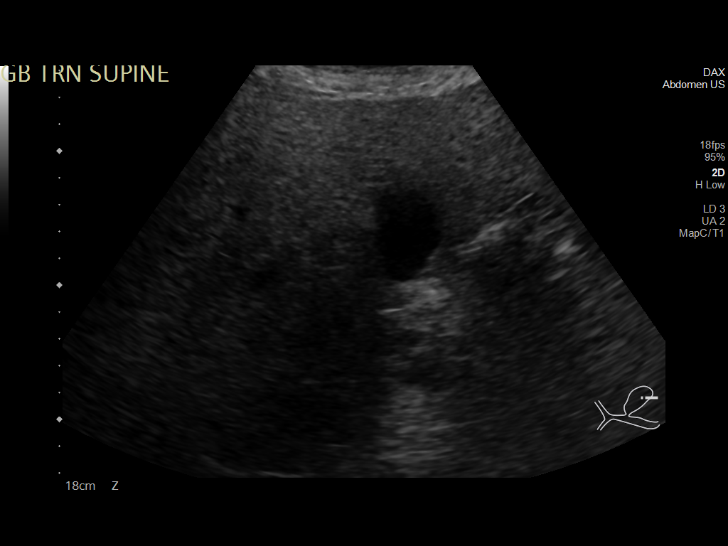
[im 13/73]
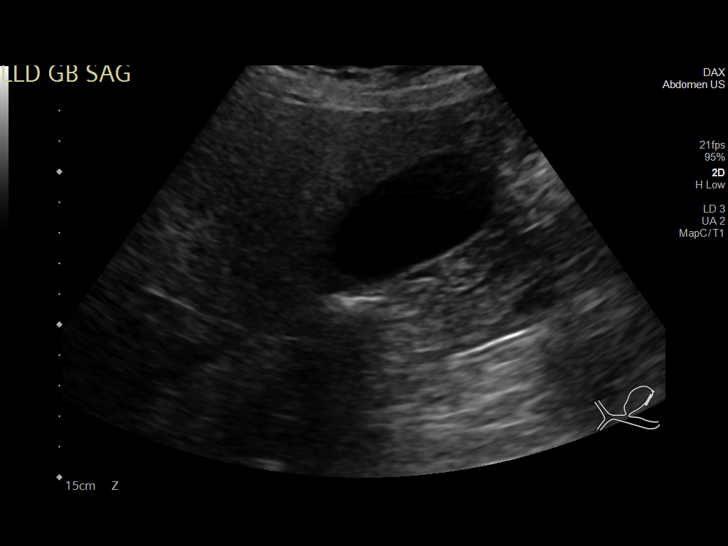
[im 19/73]
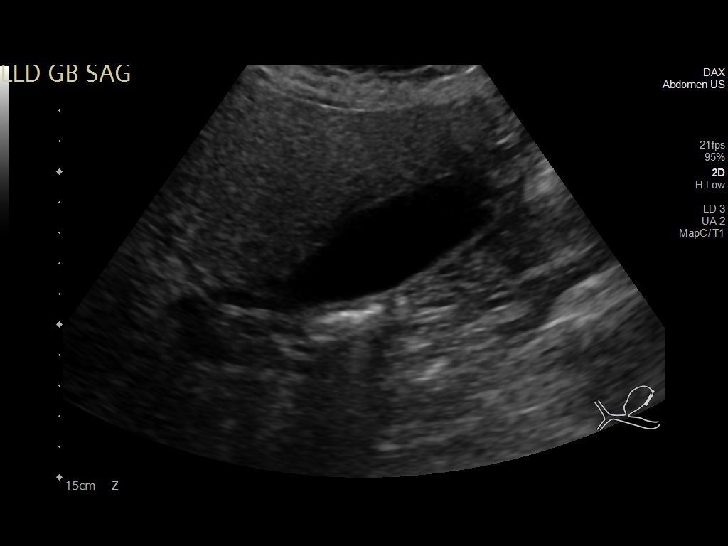
[im 25/73]
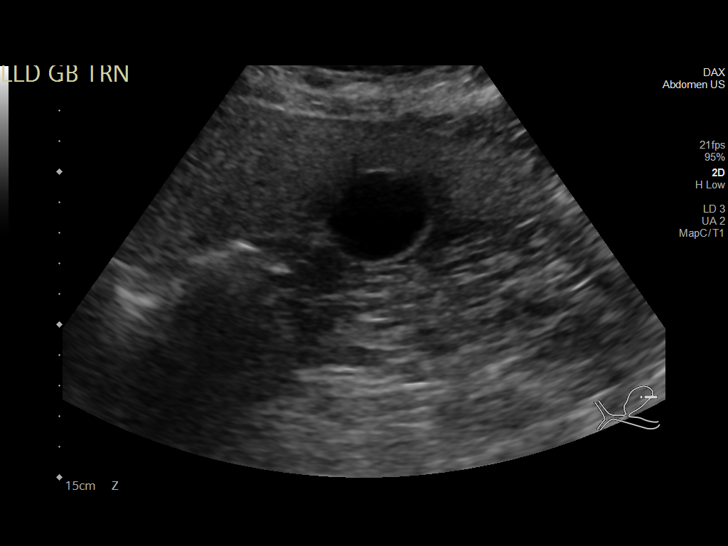
[im 28/73]
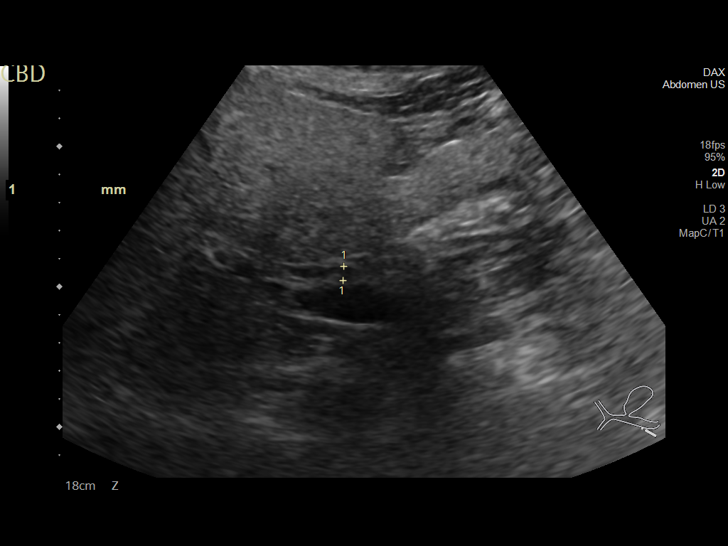
[im 34/73]
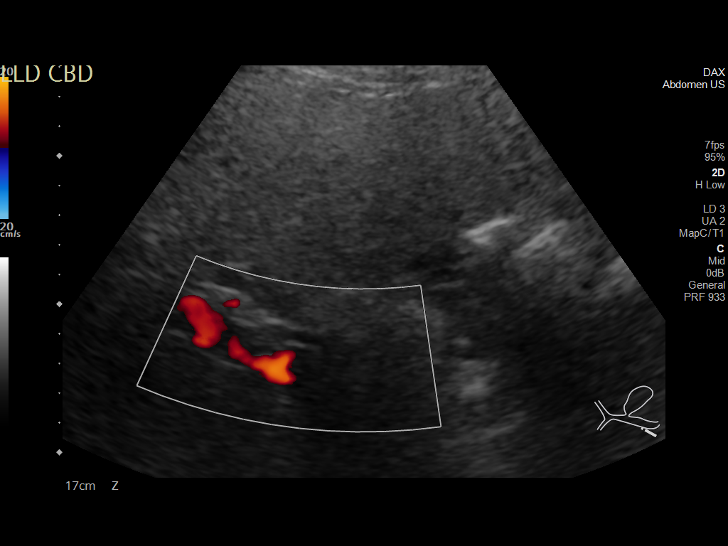
[im 40/73]
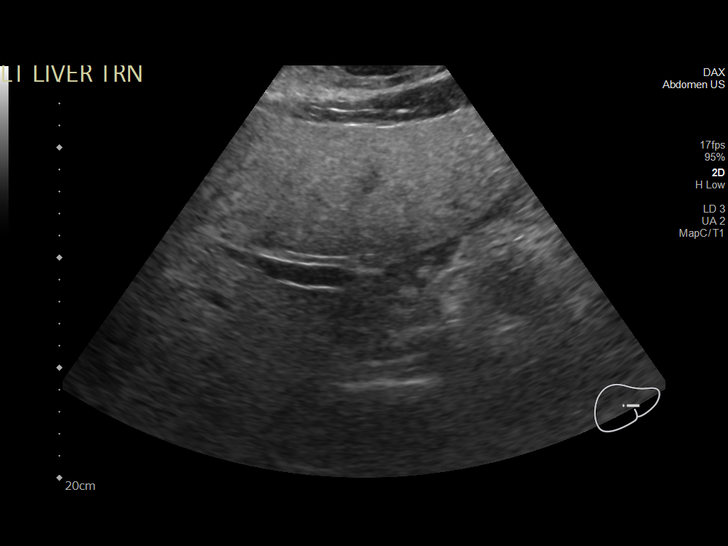
[im 46/73]
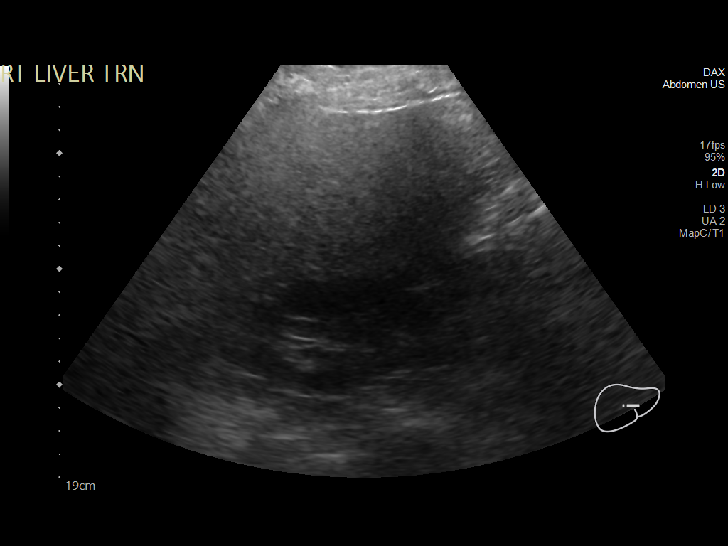
[im 49/73]
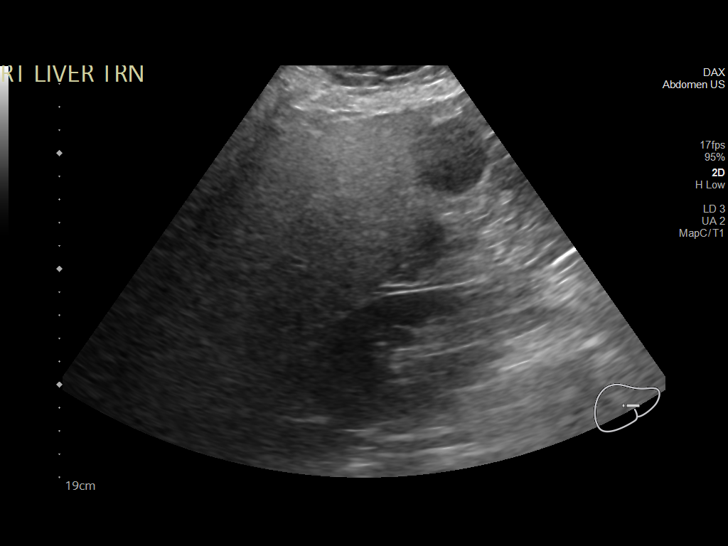
[im 55/73]
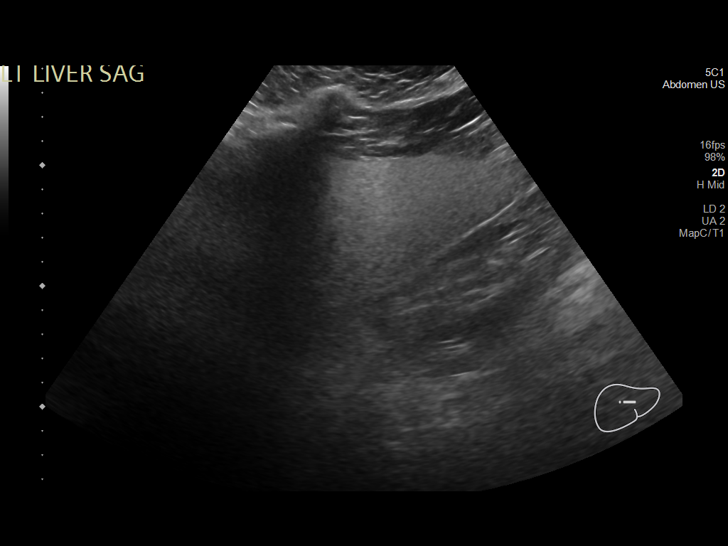
[im 61/73]
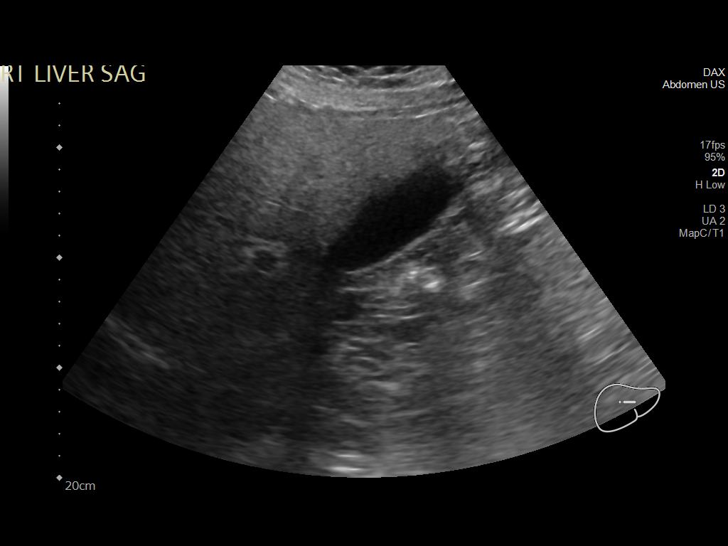
[im 67/73]
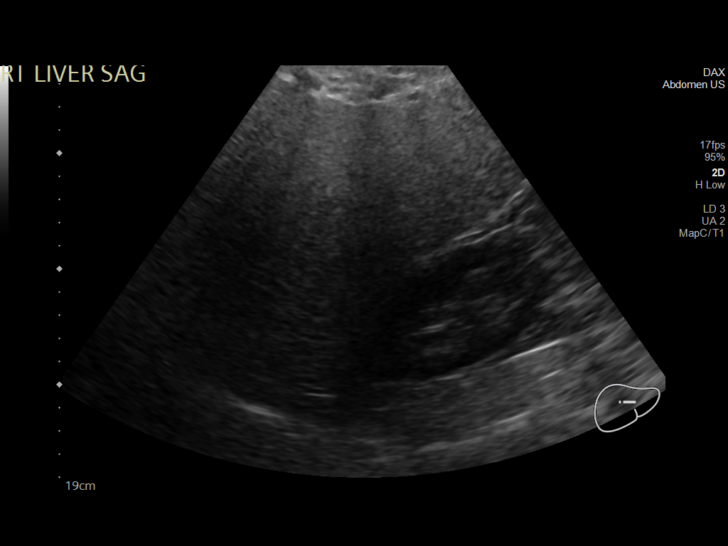
[im 73/73]
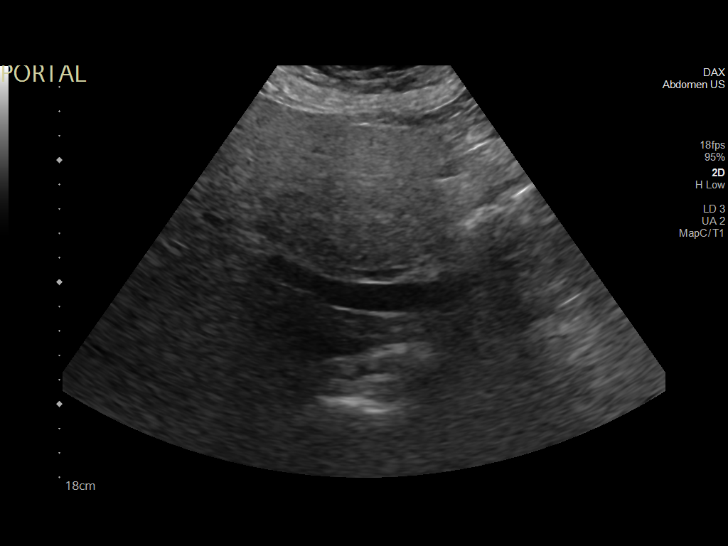

[14 of 25 positions shown; findings below may reference images not displayed]

FINDINGS: Gallbladder:

No gallstones or wall thickening visualized. No sonographic Murphy
sign noted by sonographer.

Common bile duct:

Diameter: 6 mm

Liver:

Parenchymal echogenicity is diffusely increased likely reflecting
moderate fatty infiltration. There are no focal lesions. Portal vein
is patent on color Doppler imaging with normal direction of blood
flow towards the liver.

Other: None.
IMPRESSION: 1. Moderate fatty infiltration of the liver.
2. No evidence of cholelithiasis or acute cholecystitis.

## 2022-08-02 ENCOUNTER — Other Ambulatory Visit (HOSPITAL_BASED_OUTPATIENT_CLINIC_OR_DEPARTMENT_OTHER): Payer: Self-pay

## 2022-08-03 ENCOUNTER — Other Ambulatory Visit (HOSPITAL_BASED_OUTPATIENT_CLINIC_OR_DEPARTMENT_OTHER): Payer: Self-pay

## 2022-08-03 MED ORDER — MOUNJARO 10 MG/0.5ML ~~LOC~~ SOAJ
10.0000 mg | SUBCUTANEOUS | 2 refills | Status: DC
  Filled 2022-08-03: qty 2, 28d supply, fill #0

## 2022-08-15 ENCOUNTER — Other Ambulatory Visit (HOSPITAL_BASED_OUTPATIENT_CLINIC_OR_DEPARTMENT_OTHER): Payer: Self-pay

## 2022-08-15 MED ORDER — MOUNJARO 12.5 MG/0.5ML ~~LOC~~ SOAJ
12.5000 mg | SUBCUTANEOUS | 2 refills | Status: DC
  Filled 2022-08-15 – 2022-09-19 (×2): qty 2, 28d supply, fill #0
  Filled 2022-10-12: qty 2, 28d supply, fill #1
  Filled 2022-11-08: qty 2, 28d supply, fill #2

## 2022-09-19 ENCOUNTER — Other Ambulatory Visit (HOSPITAL_BASED_OUTPATIENT_CLINIC_OR_DEPARTMENT_OTHER): Payer: Self-pay

## 2022-09-19 ENCOUNTER — Other Ambulatory Visit (HOSPITAL_COMMUNITY): Payer: Self-pay

## 2022-11-09 ENCOUNTER — Other Ambulatory Visit (HOSPITAL_BASED_OUTPATIENT_CLINIC_OR_DEPARTMENT_OTHER): Payer: Self-pay

## 2022-11-09 ENCOUNTER — Other Ambulatory Visit: Payer: Self-pay

## 2022-11-13 ENCOUNTER — Other Ambulatory Visit (HOSPITAL_BASED_OUTPATIENT_CLINIC_OR_DEPARTMENT_OTHER): Payer: Self-pay

## 2022-11-13 MED ORDER — METFORMIN HCL ER 500 MG PO TB24
500.0000 mg | ORAL_TABLET | Freq: Every day | ORAL | 0 refills | Status: AC
  Filled 2022-11-13: qty 90, 90d supply, fill #0

## 2022-11-13 MED ORDER — AMPHETAMINE-DEXTROAMPHET ER 10 MG PO CP24
10.0000 mg | ORAL_CAPSULE | Freq: Every day | ORAL | 0 refills | Status: DC
  Filled 2022-11-13: qty 30, 30d supply, fill #0

## 2022-12-12 ENCOUNTER — Other Ambulatory Visit (HOSPITAL_BASED_OUTPATIENT_CLINIC_OR_DEPARTMENT_OTHER): Payer: Self-pay

## 2022-12-13 ENCOUNTER — Other Ambulatory Visit (HOSPITAL_BASED_OUTPATIENT_CLINIC_OR_DEPARTMENT_OTHER): Payer: Self-pay

## 2022-12-13 MED ORDER — MOUNJARO 12.5 MG/0.5ML ~~LOC~~ SOAJ
12.5000 mg | SUBCUTANEOUS | 2 refills | Status: DC
  Filled 2022-12-13: qty 2, 28d supply, fill #0
  Filled 2023-03-28: qty 2, 28d supply, fill #1

## 2022-12-20 ENCOUNTER — Other Ambulatory Visit (HOSPITAL_BASED_OUTPATIENT_CLINIC_OR_DEPARTMENT_OTHER): Payer: Self-pay

## 2022-12-20 MED ORDER — AMPHETAMINE-DEXTROAMPHET ER 10 MG PO CP24
10.0000 mg | ORAL_CAPSULE | Freq: Every day | ORAL | 0 refills | Status: DC
  Filled 2022-12-20: qty 30, 30d supply, fill #0

## 2022-12-27 ENCOUNTER — Other Ambulatory Visit (HOSPITAL_BASED_OUTPATIENT_CLINIC_OR_DEPARTMENT_OTHER): Payer: Self-pay

## 2022-12-27 MED ORDER — MOUNJARO 12.5 MG/0.5ML ~~LOC~~ SOAJ
12.5000 mg | SUBCUTANEOUS | 2 refills | Status: DC
  Filled 2022-12-27 – 2023-01-07 (×2): qty 2, 28d supply, fill #0
  Filled 2023-02-01 – 2023-02-02 (×2): qty 2, 28d supply, fill #1
  Filled 2023-03-01: qty 2, 28d supply, fill #2

## 2022-12-27 MED ORDER — AMPHETAMINE-DEXTROAMPHET ER 10 MG PO CP24
10.0000 mg | ORAL_CAPSULE | Freq: Two times a day (BID) | ORAL | 0 refills | Status: DC
  Filled 2023-01-07 (×3): qty 60, 30d supply, fill #0

## 2023-01-07 ENCOUNTER — Other Ambulatory Visit (HOSPITAL_BASED_OUTPATIENT_CLINIC_OR_DEPARTMENT_OTHER): Payer: Self-pay

## 2023-01-07 ENCOUNTER — Other Ambulatory Visit: Payer: Self-pay

## 2023-02-02 ENCOUNTER — Other Ambulatory Visit (HOSPITAL_BASED_OUTPATIENT_CLINIC_OR_DEPARTMENT_OTHER): Payer: Self-pay

## 2023-03-01 ENCOUNTER — Other Ambulatory Visit (HOSPITAL_BASED_OUTPATIENT_CLINIC_OR_DEPARTMENT_OTHER): Payer: Self-pay

## 2023-03-05 ENCOUNTER — Other Ambulatory Visit (HOSPITAL_BASED_OUTPATIENT_CLINIC_OR_DEPARTMENT_OTHER): Payer: Self-pay

## 2023-03-05 MED ORDER — AMPHETAMINE-DEXTROAMPHET ER 10 MG PO CP24
10.0000 mg | ORAL_CAPSULE | Freq: Two times a day (BID) | ORAL | 0 refills | Status: DC
  Filled 2023-05-01: qty 60, 30d supply, fill #0

## 2023-03-08 ENCOUNTER — Other Ambulatory Visit (HOSPITAL_BASED_OUTPATIENT_CLINIC_OR_DEPARTMENT_OTHER): Payer: Self-pay

## 2023-03-08 MED ORDER — AMPHETAMINE-DEXTROAMPHET ER 10 MG PO CP24
10.0000 mg | ORAL_CAPSULE | Freq: Two times a day (BID) | ORAL | 0 refills | Status: DC
  Filled 2023-03-08 – 2023-03-12 (×2): qty 60, 30d supply, fill #0

## 2023-03-12 ENCOUNTER — Other Ambulatory Visit: Payer: Self-pay

## 2023-03-12 ENCOUNTER — Other Ambulatory Visit (HOSPITAL_BASED_OUTPATIENT_CLINIC_OR_DEPARTMENT_OTHER): Payer: Self-pay

## 2023-04-11 ENCOUNTER — Ambulatory Visit: Payer: BC Managed Care – PPO | Admitting: Obstetrics and Gynecology

## 2023-04-11 ENCOUNTER — Other Ambulatory Visit (HOSPITAL_COMMUNITY)
Admission: RE | Admit: 2023-04-11 | Discharge: 2023-04-11 | Disposition: A | Discharge status: Home / Self Care | Payer: BC Managed Care – PPO | Source: Ambulatory Visit | Attending: Obstetrics and Gynecology | Admitting: Obstetrics and Gynecology

## 2023-04-11 ENCOUNTER — Encounter: Payer: Self-pay | Admitting: Obstetrics and Gynecology

## 2023-04-11 VITALS — BP 118/80 | HR 100 | Ht 63.75 in | Wt 169.0 lb

## 2023-04-11 DIAGNOSIS — Z30432 Encounter for removal of intrauterine contraceptive device: Secondary | ICD-10-CM

## 2023-04-11 DIAGNOSIS — E2839 Other primary ovarian failure: Secondary | ICD-10-CM

## 2023-04-11 DIAGNOSIS — Z1331 Encounter for screening for depression: Secondary | ICD-10-CM | POA: Diagnosis not present

## 2023-04-11 DIAGNOSIS — Z01419 Encounter for gynecological examination (general) (routine) without abnormal findings: Secondary | ICD-10-CM

## 2023-04-11 DIAGNOSIS — Z1211 Encounter for screening for malignant neoplasm of colon: Secondary | ICD-10-CM

## 2023-04-11 DIAGNOSIS — Z1231 Encounter for screening mammogram for malignant neoplasm of breast: Secondary | ICD-10-CM

## 2023-04-11 DIAGNOSIS — Z975 Presence of (intrauterine) contraceptive device: Secondary | ICD-10-CM

## 2023-04-11 MED ORDER — INTRAROSA 6.5 MG VA INST: 1.0000 | VAGINAL_INSERT | Freq: Every evening | VAGINAL | 12 refills | Status: AC | PRN

## 2023-04-11 NOTE — Progress Notes (Signed)
53 y.o. y.o. female here for annual exam. No LMP recorded. (Menstrual status: IUD).   Reports vaginal dryness and dyspareunia Reports loss of height as well and would like a baseline dxa Birth control: mirena IUD for 15 years. Would like it removed Last mammogram: 2010, birads 1 reports she did last month and has done yearly Last colonoscopy: cologuard 2 1/2 years ago. Referral for colonoscopy placed Last pap smear 15 years ago.  Denies any abnormal pap smears  Last IUD removal was very uncomfortable for the patient. IUD strings were not seen She had a near syncope episode  Body mass index is 29.24 kg/m.     04/11/2023    2:27 PM  Depression screen PHQ 2/9  Decreased Interest 0  Down, Depressed, Hopeless 0  PHQ - 2 Score 0    Blood pressure 118/80, pulse 100, height 5' 3.75" (1.619 m), weight 169 lb (76.7 kg), SpO2 99%.  No results found for: "DIAGPAP", "HPVHIGH", "ADEQPAP"  GYN HISTORY: No results found for: "DIAGPAP", "HPVHIGH", "ADEQPAP"  OB History  Gravida Para Term Preterm AB Living  4 2 0 2 2 3   SAB IAB Ectopic Multiple Live Births  0 2  1 3     # Outcome Date GA Lbr Len/2nd Weight Sex Type Anes PTL Lv  4A Preterm           4B Preterm           3 Preterm           2 IAB           1 IAB             Past Medical History:  Diagnosis Date   Depression    Diabetes mellitus without complication (HCC)    Hypertension    Infertility, female    Migraines     Past Surgical History:  Procedure Laterality Date   CESAREAN SECTION     WRIST SURGERY      Current Outpatient Medications on File Prior to Visit  Medication Sig Dispense Refill   amphetamine-dextroamphetamine (ADDERALL XR) 10 MG 24 hr capsule Take 1 capsule (10 mg total) by mouth 2 (two) times daily. 60 capsule 0   chlorthalidone (HYGROTON) 25 MG tablet Take 1 tablet by mouth daily.     levonorgestrel (MIRENA) 20 MCG/24HR IUD 1 each by Intrauterine route once.     losartan (COZAAR) 25 MG tablet  Take by mouth.     metFORMIN (GLUCOPHAGE-XR) 500 MG 24 hr tablet Take 1 tablet (500 mg total) by mouth daily with breakfast. 90 tablet 0   omeprazole (PRILOSEC) 20 MG capsule Take 20 mg by mouth daily.     potassium chloride (KLOR-CON) 10 MEQ tablet Take 10 mEq by mouth daily.     tirzepatide (MOUNJARO) 12.5 MG/0.5ML Pen Inject 12.5 mg into the skin once a week. 2 mL 2   traZODone (DESYREL) 50 MG tablet 1 po qhs     amphetamine-dextroamphetamine (ADDERALL XR) 10 MG 24 hr capsule Take 1 capsule (10 mg total) by mouth 2 (two) times daily. 60 capsule 0   [DISCONTINUED] lisinopril (PRINIVIL,ZESTRIL) 10 MG tablet Take 1 tablet (10 mg total) by mouth daily. 30 tablet 5   No current facility-administered medications on file prior to visit.    Social History   Socioeconomic History   Marital status: Married    Spouse name: Not on file   Number of children: Not on file   Years of education:  Not on file   Highest education level: Not on file  Occupational History   Not on file  Tobacco Use   Smoking status: Former    Current packs/day: 0.00    Types: Cigarettes    Quit date: 04/07/2011    Years since quitting: 12.0   Smokeless tobacco: Not on file  Substance and Sexual Activity   Alcohol use: Not Currently   Drug use: Not Currently   Sexual activity: Yes    Partners: Male    Comment: iud expired  Other Topics Concern   Not on file  Social History Narrative   Not on file   Social Drivers of Health   Financial Resource Strain: Low Risk  (04/18/2022)   Received from [facility]   Overall Financial Resource Strain (CARDIA)    Difficulty of Paying Living Expenses: Not hard at all  Food Insecurity: No Food Insecurity (06/07/2022)   Received from [facility]   Hunger Vital Sign    Worried About Running Out of Food in the Last Year: Never true    Ran Out of Food in the Last Year: Never true  Transportation Needs: No Transportation Needs (06/07/2022)   Received from [facility] - Transportation    Lack of Transportation (Medical): No    Lack of Transportation (Non-Medical): No  Physical Activity: Unknown (04/18/2022)   Received from [facility]   Exercise Vital Sign    Days of Exercise per Week: 0 days    Minutes of Exercise per Session: Not on file  Recent Concern: Physical Activity - Inactive (04/18/2022)   Received from [facility]   Exercise Vital Sign    Days of Exercise per Week: 0 days    Minutes of Exercise per Session: 10 min  Stress: No Stress Concern Present (06/07/2022)   Received from [facility] - Occupational Stress Questionnaire    Feeling of Stress : Only a little  Social Connections: Somewhat Isolated (04/18/2022)   Received from [facility]   Social Network    How would you rate your social network (family, work, friends)?: Restricted participation with some degree of social isolation  Intimate Partner Violence: Not At Risk (06/07/2022)   Received from [facility]   HITS    Over the last 12 months how often did your partner physically hurt you?: Never    Over the last 12 months how often did your partner insult you or talk down to you?: Never    Over the last 12 months how often did your partner threaten you with physical harm?: Never    Over the last 12 months how often did your partner scream or curse at you?: Never    Family History  Problem Relation Age of Onset   Thyroid disease Mother    Thyroid disease Maternal Grandmother    Heart attack Maternal Grandfather    Heart attack Paternal Grandfather      No Known Allergies  PROCEDURE:  IUD removal procedure: Consent obtained.  Timed out performed.  Everyone agreed. SVE: IUD strings seen and gasped with a vanderbilt and the entire IUD was removed easily.  Both arms and body were seen and intact. Patient tolerated the procedure well.  Patient's last menstrual period was No LMP recorded. (Menstrual status: IUD)..           Sexually active: yes but painful and no libido     Review of Systems Alls systems reviewed  and are negative.     Physical Exam Constitutional:      Appearance: Normal appearance.  Genitourinary:     Vulva and urethral meatus normal.     No lesions in the vagina.     Right Labia: No rash, lesions or skin changes.    Left Labia: No lesions, skin changes or rash.    No vaginal discharge or tenderness.     No vaginal prolapse present.    No vaginal atrophy present.     Right Adnexa: not tender, not palpable and no mass present.    Left Adnexa: not tender, not palpable and no mass present.    No cervical motion tenderness or discharge.     Uterus is not enlarged, tender or irregular.  Breasts:    Right: Normal.     Left: Normal.  HENT:     Head: Normocephalic.  Neck:     Thyroid: No thyroid mass, thyromegaly or thyroid tenderness.  Cardiovascular:     Rate and Rhythm: Normal rate and regular rhythm.     Heart sounds: Normal heart sounds, S1 normal and S2 normal.  Pulmonary:     Effort: Pulmonary effort is normal.     Breath sounds: Normal breath sounds and air entry.  Abdominal:     General: There is no distension.     Palpations: Abdomen is soft. There is no mass.     Tenderness: There is no abdominal tenderness. There is no guarding or rebound.  Musculoskeletal:        General: Normal range of motion.     Cervical back: Full passive range of motion without pain, normal range of motion and neck supple. No tenderness.     Right lower leg: No edema.     Left lower leg: No edema.  Neurological:     Mental Status: She is alert.  Skin:    General: Skin is warm.  Psychiatric:        Mood and Affect: Mood normal.        Behavior: Behavior normal.        Thought Content: Thought content normal.  Vitals and nursing note reviewed. Exam conducted with a chaperone present.       A:         Well Woman GYN exam, IUD removal, menopause, significant loss of height,  dyspareunia, atrophic vaginitis                             P:        Pap smear collected today Encouraged annual mammogram screening Colon cancer screening referral placed today DXA ordered today Labs and immunizations to do with PMD Discussed breast self exams Encouraged healthy lifestyle practices Encouraged Vit D and Calcium  To begin intrarosa for vaginal dryness and decreased libido No follow-ups on file.  Earley Favor

## 2023-04-16 ENCOUNTER — Encounter: Payer: Self-pay | Admitting: Obstetrics and Gynecology

## 2023-04-16 LAB — CYTOLOGY - PAP
Comment: NEGATIVE
Diagnosis: NEGATIVE
High risk HPV: NEGATIVE

## 2023-04-17 ENCOUNTER — Telehealth: Payer: Self-pay

## 2023-04-17 NOTE — Telephone Encounter (Signed)
 Pt LVM in triage line stating that she would like for DXA order to be sent to [facility] in [place] on [address].  Spoke w/ the pt and she provided fax # [phone] and advised that she already has an appt scheduled for next week and needs the order faxed over by today or will have to r/s.   Order written up and given to provider for authorization.

## 2023-04-17 NOTE — Telephone Encounter (Signed)
 DXA order authorized by [facility] & faxed to pt's preferred location successfully at [phone].  Pt notified via mychart msg.   Encounter closed.

## 2023-05-01 ENCOUNTER — Other Ambulatory Visit: Payer: Self-pay

## 2023-05-01 ENCOUNTER — Other Ambulatory Visit (HOSPITAL_BASED_OUTPATIENT_CLINIC_OR_DEPARTMENT_OTHER): Payer: Self-pay

## 2023-05-01 MED ORDER — TIRZEPATIDE 12.5 MG/0.5ML ~~LOC~~ SOAJ
12.5000 mg | SUBCUTANEOUS | 2 refills | Status: AC
  Filled 2023-05-01: qty 2, 28d supply, fill #0
  Filled 2023-05-24 – 2023-05-25 (×2): qty 2, 28d supply, fill #1
  Filled 2023-07-18: qty 2, 28d supply, fill #2

## 2023-05-25 ENCOUNTER — Other Ambulatory Visit (HOSPITAL_BASED_OUTPATIENT_CLINIC_OR_DEPARTMENT_OTHER): Payer: Self-pay

## 2023-05-28 ENCOUNTER — Other Ambulatory Visit (HOSPITAL_BASED_OUTPATIENT_CLINIC_OR_DEPARTMENT_OTHER): Payer: Self-pay

## 2023-05-28 MED ORDER — AMPHETAMINE-DEXTROAMPHET ER 10 MG PO CP24: 10.0000 mg | ORAL_CAPSULE | Freq: Two times a day (BID) | ORAL | 0 refills | Status: DC

## 2023-05-28 MED ORDER — TRIAMCINOLONE ACETONIDE 0.1 % EX LOTN
TOPICAL_LOTION | CUTANEOUS | 0 refills | Status: AC
  Filled 2023-05-28: qty 60, 30d supply, fill #0

## 2023-05-28 MED ORDER — AMPHETAMINE-DEXTROAMPHET ER 10 MG PO CP24
10.0000 mg | ORAL_CAPSULE | Freq: Two times a day (BID) | ORAL | 0 refills | Status: DC
  Filled 2023-07-20: qty 60, 30d supply, fill #0

## 2023-05-28 MED ORDER — MOUNJARO 12.5 MG/0.5ML ~~LOC~~ SOAJ
12.5000 mg | SUBCUTANEOUS | 2 refills | Status: DC
  Filled 2023-05-28 – 2023-06-23 (×2): qty 2, 28d supply, fill #0
  Filled 2023-08-17: qty 2, 28d supply, fill #1
  Filled 2023-10-07: qty 2, 28d supply, fill #2

## 2023-05-28 MED ORDER — AMPHETAMINE-DEXTROAMPHET ER 10 MG PO CP24
10.0000 mg | ORAL_CAPSULE | Freq: Two times a day (BID) | ORAL | 0 refills | Status: DC
  Filled 2023-05-29: qty 60, 30d supply, fill #0

## 2023-05-28 MED ORDER — METFORMIN HCL ER 500 MG PO TB24
500.0000 mg | ORAL_TABLET | Freq: Every day | ORAL | 0 refills | Status: DC
  Filled 2023-05-28 – 2023-09-12 (×2): qty 90, 90d supply, fill #0

## 2023-05-29 ENCOUNTER — Other Ambulatory Visit (HOSPITAL_BASED_OUTPATIENT_CLINIC_OR_DEPARTMENT_OTHER): Payer: Self-pay

## 2023-06-24 ENCOUNTER — Other Ambulatory Visit (HOSPITAL_BASED_OUTPATIENT_CLINIC_OR_DEPARTMENT_OTHER): Payer: Self-pay

## 2023-07-18 ENCOUNTER — Other Ambulatory Visit (HOSPITAL_BASED_OUTPATIENT_CLINIC_OR_DEPARTMENT_OTHER): Payer: Self-pay

## 2023-07-20 ENCOUNTER — Other Ambulatory Visit (HOSPITAL_BASED_OUTPATIENT_CLINIC_OR_DEPARTMENT_OTHER): Payer: Self-pay

## 2023-07-21 ENCOUNTER — Other Ambulatory Visit (HOSPITAL_BASED_OUTPATIENT_CLINIC_OR_DEPARTMENT_OTHER): Payer: Self-pay

## 2023-07-21 MED ORDER — AMPHETAMINE-DEXTROAMPHET ER 10 MG PO CP24
10.0000 mg | ORAL_CAPSULE | Freq: Two times a day (BID) | ORAL | 0 refills | Status: DC
  Filled 2023-07-21 – 2023-10-07 (×2): qty 60, 30d supply, fill #0

## 2023-07-22 ENCOUNTER — Other Ambulatory Visit (HOSPITAL_BASED_OUTPATIENT_CLINIC_OR_DEPARTMENT_OTHER): Payer: Self-pay

## 2023-08-29 ENCOUNTER — Other Ambulatory Visit (HOSPITAL_BASED_OUTPATIENT_CLINIC_OR_DEPARTMENT_OTHER): Payer: Self-pay

## 2023-08-29 MED ORDER — AMPHETAMINE-DEXTROAMPHET ER 10 MG PO CP24
10.0000 mg | ORAL_CAPSULE | Freq: Two times a day (BID) | ORAL | 0 refills | Status: DC
  Filled 2023-08-29: qty 60, 30d supply, fill #0

## 2023-08-29 MED ORDER — AMPHETAMINE-DEXTROAMPHET ER 10 MG PO CP24: 10.0000 mg | ORAL_CAPSULE | Freq: Two times a day (BID) | ORAL | 0 refills | Status: DC

## 2023-08-29 MED ORDER — MOUNJARO 12.5 MG/0.5ML ~~LOC~~ SOAJ
12.5000 mg | SUBCUTANEOUS | 1 refills | Status: DC
  Filled 2023-08-29 – 2023-09-12 (×2): qty 6, 84d supply, fill #0

## 2023-08-29 MED ORDER — ESTRADIOL 0.1 MG/GM VA CREA
1.0000 g | TOPICAL_CREAM | Freq: Every day | VAGINAL | 1 refills | Status: AC
  Filled 2023-08-29: qty 42.5, 43d supply, fill #0
  Filled 2023-10-07: qty 42.5, 43d supply, fill #1

## 2023-09-12 ENCOUNTER — Other Ambulatory Visit (HOSPITAL_BASED_OUTPATIENT_CLINIC_OR_DEPARTMENT_OTHER): Payer: Self-pay

## 2023-09-12 ENCOUNTER — Other Ambulatory Visit: Payer: Self-pay

## 2023-10-07 ENCOUNTER — Other Ambulatory Visit (HOSPITAL_BASED_OUTPATIENT_CLINIC_OR_DEPARTMENT_OTHER): Payer: Self-pay

## 2023-10-08 ENCOUNTER — Other Ambulatory Visit: Payer: Self-pay

## 2023-11-29 ENCOUNTER — Other Ambulatory Visit (HOSPITAL_BASED_OUTPATIENT_CLINIC_OR_DEPARTMENT_OTHER): Payer: Self-pay

## 2023-11-29 MED ORDER — AMPHET-DEXTROAMPHET 3-BEAD ER 12.5 MG PO CP24
12.5000 mg | ORAL_CAPSULE | Freq: Every day | ORAL | 0 refills | Status: DC
  Filled 2023-11-29 – 2024-01-02 (×3): qty 30, 30d supply, fill #0

## 2023-11-29 MED ORDER — ESTRADIOL 0.025 MG/24HR TD PTWK
0.0250 mg | MEDICATED_PATCH | TRANSDERMAL | 0 refills | Status: DC
  Filled 2024-01-30: qty 4, 28d supply, fill #0

## 2023-11-29 MED ORDER — MOUNJARO 12.5 MG/0.5ML ~~LOC~~ SOAJ
12.5000 mg | SUBCUTANEOUS | 1 refills | Status: DC
  Filled 2023-11-29: qty 6, 84d supply, fill #0
  Filled 2024-02-08: qty 6, 84d supply, fill #1

## 2023-11-29 MED ORDER — TRAZODONE HCL 50 MG PO TABS
50.0000 mg | ORAL_TABLET | Freq: Every day | ORAL | 1 refills | Status: DC
  Filled 2023-11-29: qty 90, 90d supply, fill #0

## 2023-11-29 MED ORDER — LISDEXAMFETAMINE DIMESYLATE 30 MG PO CAPS
30.0000 mg | ORAL_CAPSULE | Freq: Every day | ORAL | 0 refills | Status: AC
  Filled 2023-11-29: qty 30, 30d supply, fill #0

## 2023-11-29 MED ORDER — LOSARTAN POTASSIUM 25 MG PO TABS
25.0000 mg | ORAL_TABLET | Freq: Every morning | ORAL | 1 refills | Status: AC
  Filled 2023-11-29 – 2024-02-29 (×2): qty 90, 90d supply, fill #0

## 2023-11-29 MED ORDER — ESTRADIOL 0.025 MG/24HR TD PTWK
0.0250 mg | MEDICATED_PATCH | TRANSDERMAL | 0 refills | Status: DC
  Filled 2023-11-29: qty 4, 28d supply, fill #0

## 2023-11-29 MED ORDER — CHLORTHALIDONE 25 MG PO TABS
25.0000 mg | ORAL_TABLET | Freq: Every day | ORAL | 1 refills | Status: AC
  Filled 2023-11-29: qty 90, 90d supply, fill #0

## 2023-11-29 MED ORDER — PROGESTERONE MICRONIZED 100 MG PO CAPS
100.0000 mg | ORAL_CAPSULE | Freq: Every day | ORAL | 1 refills | Status: AC
  Filled 2023-11-29: qty 90, 90d supply, fill #0
  Filled 2024-02-29: qty 90, 90d supply, fill #1

## 2023-11-29 MED ORDER — METFORMIN HCL ER 500 MG PO TB24
500.0000 mg | ORAL_TABLET | Freq: Every day | ORAL | 1 refills | Status: DC
  Filled 2023-11-29: qty 90, 90d supply, fill #0
  Filled 2024-02-26: qty 90, 90d supply, fill #1

## 2023-12-04 ENCOUNTER — Other Ambulatory Visit (HOSPITAL_BASED_OUTPATIENT_CLINIC_OR_DEPARTMENT_OTHER): Payer: Self-pay

## 2023-12-08 LAB — COLOGUARD: COLOGUARD: NEGATIVE

## 2023-12-26 ENCOUNTER — Other Ambulatory Visit (HOSPITAL_BASED_OUTPATIENT_CLINIC_OR_DEPARTMENT_OTHER): Payer: Self-pay

## 2023-12-26 MED ORDER — ESTRADIOL 0.025 MG/24HR TD PTWK
0.0250 mg | MEDICATED_PATCH | TRANSDERMAL | 0 refills | Status: DC
  Filled 2023-12-26: qty 4, 28d supply, fill #0

## 2023-12-31 ENCOUNTER — Other Ambulatory Visit (HOSPITAL_BASED_OUTPATIENT_CLINIC_OR_DEPARTMENT_OTHER): Payer: Self-pay

## 2024-01-02 ENCOUNTER — Other Ambulatory Visit (HOSPITAL_BASED_OUTPATIENT_CLINIC_OR_DEPARTMENT_OTHER): Payer: Self-pay

## 2024-01-12 ENCOUNTER — Other Ambulatory Visit (HOSPITAL_BASED_OUTPATIENT_CLINIC_OR_DEPARTMENT_OTHER): Payer: Self-pay

## 2024-01-12 MED ORDER — LISDEXAMFETAMINE DIMESYLATE 30 MG PO CAPS
30.0000 mg | ORAL_CAPSULE | Freq: Every morning | ORAL | 0 refills | Status: DC
  Filled 2024-01-12: qty 30, 30d supply, fill #0

## 2024-01-13 ENCOUNTER — Other Ambulatory Visit (HOSPITAL_BASED_OUTPATIENT_CLINIC_OR_DEPARTMENT_OTHER): Payer: Self-pay

## 2024-01-31 ENCOUNTER — Other Ambulatory Visit (HOSPITAL_BASED_OUTPATIENT_CLINIC_OR_DEPARTMENT_OTHER): Payer: Self-pay

## 2024-02-10 ENCOUNTER — Other Ambulatory Visit (HOSPITAL_BASED_OUTPATIENT_CLINIC_OR_DEPARTMENT_OTHER): Payer: Self-pay

## 2024-02-14 ENCOUNTER — Other Ambulatory Visit (HOSPITAL_BASED_OUTPATIENT_CLINIC_OR_DEPARTMENT_OTHER): Payer: Self-pay

## 2024-02-14 MED ORDER — LISDEXAMFETAMINE DIMESYLATE 30 MG PO CAPS
30.0000 mg | ORAL_CAPSULE | Freq: Every morning | ORAL | 0 refills | Status: DC
  Filled 2024-03-16: qty 30, 30d supply, fill #0

## 2024-02-14 MED ORDER — LISDEXAMFETAMINE DIMESYLATE 30 MG PO CAPS: 30.0000 mg | ORAL_CAPSULE | Freq: Every morning | ORAL | 0 refills | Status: AC

## 2024-02-14 MED ORDER — LISDEXAMFETAMINE DIMESYLATE 30 MG PO CAPS
30.0000 mg | ORAL_CAPSULE | Freq: Every morning | ORAL | 0 refills | Status: DC
  Filled 2024-02-14: qty 30, 30d supply, fill #0

## 2024-02-26 ENCOUNTER — Other Ambulatory Visit (HOSPITAL_BASED_OUTPATIENT_CLINIC_OR_DEPARTMENT_OTHER): Payer: Self-pay

## 2024-02-27 ENCOUNTER — Other Ambulatory Visit: Payer: Self-pay

## 2024-02-27 ENCOUNTER — Other Ambulatory Visit (HOSPITAL_BASED_OUTPATIENT_CLINIC_OR_DEPARTMENT_OTHER): Payer: Self-pay

## 2024-02-27 MED ORDER — ESTRADIOL 0.025 MG/24HR TD PTWK
0.0250 mg | MEDICATED_PATCH | TRANSDERMAL | 0 refills | Status: DC
  Filled 2024-02-27: qty 4, 28d supply, fill #0

## 2024-02-29 ENCOUNTER — Other Ambulatory Visit (HOSPITAL_BASED_OUTPATIENT_CLINIC_OR_DEPARTMENT_OTHER): Payer: Self-pay

## 2024-03-02 ENCOUNTER — Other Ambulatory Visit: Payer: Self-pay

## 2024-03-09 ENCOUNTER — Other Ambulatory Visit (HOSPITAL_BASED_OUTPATIENT_CLINIC_OR_DEPARTMENT_OTHER): Payer: Self-pay

## 2024-03-13 ENCOUNTER — Other Ambulatory Visit: Payer: Self-pay

## 2024-03-13 ENCOUNTER — Other Ambulatory Visit (HOSPITAL_BASED_OUTPATIENT_CLINIC_OR_DEPARTMENT_OTHER): Payer: Self-pay

## 2024-03-16 ENCOUNTER — Other Ambulatory Visit (HOSPITAL_BASED_OUTPATIENT_CLINIC_OR_DEPARTMENT_OTHER): Payer: Self-pay

## 2024-03-26 ENCOUNTER — Other Ambulatory Visit (HOSPITAL_BASED_OUTPATIENT_CLINIC_OR_DEPARTMENT_OTHER): Payer: Self-pay

## 2024-03-27 ENCOUNTER — Other Ambulatory Visit: Payer: Self-pay

## 2024-03-27 ENCOUNTER — Other Ambulatory Visit (HOSPITAL_BASED_OUTPATIENT_CLINIC_OR_DEPARTMENT_OTHER): Payer: Self-pay

## 2024-03-27 MED ORDER — ESTRADIOL 0.025 MG/24HR TD PTWK
0.0250 mg | MEDICATED_PATCH | TRANSDERMAL | 2 refills | Status: DC
  Filled 2024-03-27: qty 4, 28d supply, fill #0

## 2024-04-14 ENCOUNTER — Other Ambulatory Visit (HOSPITAL_BASED_OUTPATIENT_CLINIC_OR_DEPARTMENT_OTHER): Payer: Self-pay

## 2024-04-14 ENCOUNTER — Encounter: Payer: Self-pay | Admitting: Obstetrics and Gynecology

## 2024-04-14 ENCOUNTER — Other Ambulatory Visit (HOSPITAL_COMMUNITY)
Admission: RE | Admit: 2024-04-14 | Discharge: 2024-04-14 | Disposition: A | Source: Ambulatory Visit | Attending: Obstetrics and Gynecology | Admitting: Obstetrics and Gynecology

## 2024-04-14 ENCOUNTER — Ambulatory Visit: Payer: BC Managed Care – PPO | Admitting: Obstetrics and Gynecology

## 2024-04-14 VITALS — BP 118/80 | HR 111 | Ht 64.0 in | Wt 130.0 lb

## 2024-04-14 DIAGNOSIS — Z9189 Other specified personal risk factors, not elsewhere classified: Secondary | ICD-10-CM

## 2024-04-14 DIAGNOSIS — D229 Melanocytic nevi, unspecified: Secondary | ICD-10-CM | POA: Diagnosis not present

## 2024-04-14 DIAGNOSIS — Z1231 Encounter for screening mammogram for malignant neoplasm of breast: Secondary | ICD-10-CM

## 2024-04-14 DIAGNOSIS — Z01419 Encounter for gynecological examination (general) (routine) without abnormal findings: Secondary | ICD-10-CM

## 2024-04-14 DIAGNOSIS — E2839 Other primary ovarian failure: Secondary | ICD-10-CM | POA: Diagnosis not present

## 2024-04-14 NOTE — Progress Notes (Signed)
 Questions regarding suppositories. Did not like them, so she stopped using them. Dr. Allen put her on the estrogen patches and progesterone . Pt is interested in the pellet.

## 2024-04-15 ENCOUNTER — Other Ambulatory Visit (HOSPITAL_BASED_OUTPATIENT_CLINIC_OR_DEPARTMENT_OTHER): Payer: Self-pay

## 2024-04-15 ENCOUNTER — Ambulatory Visit: Payer: Self-pay | Admitting: Obstetrics and Gynecology

## 2024-04-15 LAB — CYTOLOGY - PAP
Adequacy: ABSENT
Diagnosis: NEGATIVE

## 2024-04-15 MED ORDER — ESTRADIOL 0.025 MG/24HR TD PTWK
0.0250 mg | MEDICATED_PATCH | TRANSDERMAL | 1 refills | Status: AC
  Filled 2024-04-15: qty 4, 28d supply, fill #0

## 2024-04-16 ENCOUNTER — Other Ambulatory Visit (HOSPITAL_BASED_OUTPATIENT_CLINIC_OR_DEPARTMENT_OTHER): Payer: Self-pay

## 2024-04-16 MED ORDER — LISDEXAMFETAMINE DIMESYLATE 30 MG PO CAPS
30.0000 mg | ORAL_CAPSULE | Freq: Every morning | ORAL | 0 refills | Status: AC
  Filled 2024-04-16: qty 30, 30d supply, fill #0

## 2025-04-15 ENCOUNTER — Ambulatory Visit: Admitting: Obstetrics and Gynecology
